# Patient Record
Sex: Female | Born: 1990 | Race: Black or African American | Hispanic: No | Marital: Single | Smoking: Never smoker
Health system: Southern US, Community
[De-identification: ages and names within clinical notes are randomized; demographics above are authoritative.]

## PROBLEM LIST (undated history)

## (undated) DIAGNOSIS — D649 Anemia, unspecified: Secondary | ICD-10-CM

## (undated) HISTORY — PX: NO PAST SURGERIES: SHX2092

---
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
## 2008-12-02 ENCOUNTER — Inpatient Hospital Stay (HOSPITAL_COMMUNITY): Admission: AD | Admit: 2008-12-02 | Discharge: 2008-12-05 | Payer: Self-pay | Admitting: Obstetrics and Gynecology

## 2009-10-23 ENCOUNTER — Emergency Department (HOSPITAL_COMMUNITY): Admission: EM | Admit: 2009-10-23 | Discharge: 2009-10-23 | Payer: Self-pay | Admitting: Emergency Medicine

## 2010-06-15 LAB — URINALYSIS, ROUTINE W REFLEX MICROSCOPIC
Glucose, UA: NEGATIVE mg/dL
Ketones, ur: NEGATIVE mg/dL
pH: 5 (ref 5.0–8.0)

## 2010-06-15 LAB — CBC
MCHC: 34.2 g/dL (ref 30.0–36.0)
RDW: 13 % (ref 11.5–15.5)

## 2010-06-15 LAB — DIFFERENTIAL
Basophils Absolute: 0 10*3/uL (ref 0.0–0.1)
Basophils Relative: 1 % (ref 0–1)
Eosinophils Absolute: 0 10*3/uL (ref 0.0–0.7)
Lymphocytes Relative: 9 % — ABNORMAL LOW (ref 12–46)
Lymphs Abs: 0.7 10*3/uL (ref 0.7–4.0)
Monocytes Absolute: 0.4 10*3/uL (ref 0.1–1.0)
Monocytes Relative: 5 % (ref 3–12)
Neutrophils Relative %: 85 % — ABNORMAL HIGH (ref 43–77)

## 2010-06-15 LAB — WET PREP, GENITAL
Trich, Wet Prep: NONE SEEN
Yeast Wet Prep HPF POC: NONE SEEN

## 2010-06-15 LAB — BASIC METABOLIC PANEL
BUN: 9 mg/dL (ref 6–23)
CO2: 22 mEq/L (ref 19–32)
Calcium: 9.6 mg/dL (ref 8.4–10.5)
Creatinine, Ser: 0.64 mg/dL (ref 0.4–1.2)
GFR calc non Af Amer: >60 mL/min (ref >60)
Glucose, Bld: 87 mg/dL (ref 70–99)

## 2010-06-15 LAB — GC/CHLAMYDIA PROBE AMP, GENITAL: GC Probe Amp, Genital: NEGATIVE

## 2010-07-05 LAB — CBC
HCT: 35.6 % — ABNORMAL LOW (ref 36.0–46.0)
HCT: 37.5 % (ref 36.0–46.0)
Hemoglobin: 12 g/dL (ref 12.0–15.0)
Hemoglobin: 12.6 g/dL (ref 12.0–15.0)
MCHC: 33.6 g/dL (ref 30.0–36.0)
Platelets: 211 10*3/uL (ref 150–400)
Platelets: 212 10*3/uL (ref 150–400)
RBC: 3.89 MIL/uL (ref 3.87–5.11)
WBC: 9.2 10*3/uL (ref 4.0–10.5)

## 2013-02-01 ENCOUNTER — Emergency Department (HOSPITAL_COMMUNITY): Payer: 59

## 2013-02-01 ENCOUNTER — Emergency Department (HOSPITAL_COMMUNITY)
Admission: EM | Admit: 2013-02-01 | Discharge: 2013-02-01 | Disposition: A | Discharge status: Home / Self Care | Payer: 59 | Attending: Emergency Medicine | Admitting: Emergency Medicine

## 2013-02-01 ENCOUNTER — Encounter (HOSPITAL_COMMUNITY): Payer: Self-pay | Admitting: Emergency Medicine

## 2013-02-01 DIAGNOSIS — M542 Cervicalgia: Secondary | ICD-10-CM

## 2013-02-01 DIAGNOSIS — Z862 Personal history of diseases of the blood and blood-forming organs and certain disorders involving the immune mechanism: Secondary | ICD-10-CM | POA: Insufficient documentation

## 2013-02-01 DIAGNOSIS — S0990XA Unspecified injury of head, initial encounter: Secondary | ICD-10-CM | POA: Insufficient documentation

## 2013-02-01 DIAGNOSIS — Y92410 Unspecified street and highway as the place of occurrence of the external cause: Secondary | ICD-10-CM | POA: Insufficient documentation

## 2013-02-01 DIAGNOSIS — S0993XA Unspecified injury of face, initial encounter: Secondary | ICD-10-CM | POA: Insufficient documentation

## 2013-02-01 DIAGNOSIS — Y9389 Activity, other specified: Secondary | ICD-10-CM | POA: Insufficient documentation

## 2013-02-01 HISTORY — DX: Anemia, unspecified: D64.9

## 2013-02-01 MED ORDER — OXYCODONE-ACETAMINOPHEN 5-325 MG PO TABS
2.0000 | ORAL_TABLET | Freq: Once | ORAL | Status: AC
  Administered 2013-02-01: 2 via ORAL
  Filled 2013-02-01: qty 2

## 2013-02-01 MED ORDER — OXYCODONE-ACETAMINOPHEN 5-325 MG PO TABS: 2.0000 | ORAL_TABLET | Freq: Four times a day (QID) | ORAL | Status: DC | PRN

## 2013-02-01 NOTE — ED Notes (Signed)
Dr Otter at bedside  

## 2013-02-01 NOTE — ED Notes (Signed)
C- collar applied by triage nurse.
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
## 2013-06-08 ENCOUNTER — Emergency Department (HOSPITAL_BASED_OUTPATIENT_CLINIC_OR_DEPARTMENT_OTHER)
Admission: EM | Admit: 2013-06-08 | Discharge: 2013-06-09 | Disposition: A | Discharge status: Home / Self Care | Payer: 59 | Attending: Emergency Medicine | Admitting: Emergency Medicine

## 2013-06-08 ENCOUNTER — Encounter (HOSPITAL_BASED_OUTPATIENT_CLINIC_OR_DEPARTMENT_OTHER): Payer: Self-pay | Admitting: Emergency Medicine

## 2013-06-08 DIAGNOSIS — Z862 Personal history of diseases of the blood and blood-forming organs and certain disorders involving the immune mechanism: Secondary | ICD-10-CM | POA: Insufficient documentation

## 2013-06-08 DIAGNOSIS — N949 Unspecified condition associated with female genital organs and menstrual cycle: Secondary | ICD-10-CM | POA: Insufficient documentation

## 2013-06-08 DIAGNOSIS — R102 Pelvic and perineal pain: Secondary | ICD-10-CM

## 2013-06-08 DIAGNOSIS — Z3202 Encounter for pregnancy test, result negative: Secondary | ICD-10-CM | POA: Insufficient documentation

## 2013-06-08 DIAGNOSIS — M545 Low back pain, unspecified: Secondary | ICD-10-CM | POA: Insufficient documentation

## 2013-06-08 LAB — PREGNANCY, URINE: Preg Test, Ur: NEGATIVE

## 2013-06-08 LAB — URINALYSIS, ROUTINE W REFLEX MICROSCOPIC
BILIRUBIN URINE: NEGATIVE
Glucose, UA: NEGATIVE mg/dL
KETONES UR: NEGATIVE mg/dL
Leukocytes, UA: NEGATIVE
Nitrite: NEGATIVE
PROTEIN: NEGATIVE mg/dL
Specific Gravity, Urine: 1.026 (ref 1.005–1.030)
UROBILINOGEN UA: 1 mg/dL (ref 0.0–1.0)
pH: 6 (ref 5.0–8.0)

## 2013-06-08 LAB — URINE MICROSCOPIC-ADD ON

## 2013-06-08 NOTE — ED Notes (Signed)
Periumbilical and suprapubic pain today with low back pain. Denies any urinary sx.

## 2013-06-08 NOTE — ED Provider Notes (Signed)
CSN: [id number]     Arrival date & time 06/08/13  2258 History  This chart was scribed for Hanley SeamenJohn L Ciena Sampley, MD by Dorothey Basemania Sutton, ED Scribe. This patient was seen in room MH06/MH06 and the patient's care was started at 11:12 PM.    Chief Complaint  Patient presents with  . Abdominal Pain   The history is provided by the patient. No language interpreter was used.   HPI Comments: Victoria [name] is a 23 y.o. female who presents to the Emergency Department complaining of a constant, cramping pain to the periumbilical and suprapubic region of the abdomen with associated lower back pain onset earlier today, around 9 hours ago. Patient states that the pain is exacerbated with movement and flexion. She reports some intermittent nausea that she states lasts 30 seconds-one minute. She denies vaginal discharge, dysuria, vomiting, diarrhea. Patient states that she uses Depo Provera for birth control, last injection was about 3 months ago. She reports that her LMP was on 03/11/2013, but that she has had some spotting recently. Patient has an allergy to amoxicillin. Patient has a history of anemia.   Past Medical History  Diagnosis Date  . Anemia    History reviewed. No pertinent past surgical history. No family history on file. History  Substance Use Topics  . Smoking status: Never Smoker   . Smokeless tobacco: Not on file  . Alcohol Use: Yes   OB History   Grav Para Term Preterm Abortions TAB SAB Ect Mult Living                 Review of Systems  A complete 10 system review of systems was obtained and all systems are negative except as noted in the HPI and PMH.    Allergies  Amoxicillin  Home Medications   Current Outpatient Rx  Name  Route  Sig  Dispense  Refill  . BIOTIN 5000 PO   Oral   Take 2 tablets by mouth every morning.         Marland Kitchen. oxyCODONE-acetaminophen (PERCOCET/ROXICET) 5-325 MG per tablet   Oral   Take 2 tablets by mouth every 6 (six) hours as needed for pain.   30  tablet   0    Triage Vitals: BP 142/77  Pulse 110  Temp(Src) 98.6 F (37 C) (Oral)  Resp 16  Ht 5' 4.5" (1.638 m)  Wt 119 lb (53.978 kg)  BMI 20.12 kg/m2  SpO2 99%  LMP 03/11/2013  Physical Exam  Nursing note and vitals reviewed. Constitutional: She is oriented to person, place, and time. She appears well-developed and well-nourished. No distress.  HENT:  Head: Normocephalic and atraumatic.  Eyes: Conjunctivae and EOM are normal. Pupils are equal, round, and reactive to light.  Neck: Normal range of motion. Neck supple.  Cardiovascular: Normal rate, regular rhythm and normal heart sounds.   Pulmonary/Chest: Effort normal and breath sounds normal. No respiratory distress.  Abdominal: Soft. Bowel sounds are normal. She exhibits no distension. There is tenderness.  Tenderness to palpation to the suprapubic region and LLQ.  Genitourinary:  No CVA tenderness.   Musculoskeletal: Normal range of motion.  Neurological: She is alert and oriented to person, place, and time.  Skin: Skin is warm and dry.  Psychiatric: She has a normal mood and affect. Her behavior is normal.  GU: Normal external genitalia; vaginal bleeding; a vaginal discharge; no cervical motion tenderness; mild adnexal tenderness without mass appreciated  ED Course  Procedures (including critical care time)  DIAGNOSTIC STUDIES: Oxygen Saturation is 99% on room air, normal by my interpretation.    COORDINATION OF CARE: 11:16 PM- Will perform a pelvic exam. Will order UA and STD testing. Discussed treatment plan with patient at bedside and patient verbalized agreement.    MDM   Nursing notes and vitals signs, including pulse oximetry, reviewed.  Summary of this visit's results, reviewed by myself:  Labs:  Results for orders placed during the hospital encounter of 06/08/13 (from the past 24 hour(s))  URINALYSIS, ROUTINE W REFLEX MICROSCOPIC     Status: Abnormal   Collection Time    06/08/13 11:05 PM       Result Value Ref Range   Color, Urine YELLOW  YELLOW   APPearance CLEAR  CLEAR   Specific Gravity, Urine 1.026  1.005 - 1.030   pH 6.0  5.0 - 8.0   Glucose, UA NEGATIVE  NEGATIVE mg/dL   Hgb urine dipstick LARGE (*) NEGATIVE   Bilirubin Urine NEGATIVE  NEGATIVE   Ketones, ur NEGATIVE  NEGATIVE mg/dL   Protein, ur NEGATIVE  NEGATIVE mg/dL   Urobilinogen, UA 1.0  0.0 - 1.0 mg/dL   Nitrite NEGATIVE  NEGATIVE   Leukocytes, UA NEGATIVE  NEGATIVE  PREGNANCY, URINE     Status: None   Collection Time    06/08/13 11:05 PM      Result Value Ref Range   Preg Test, Ur NEGATIVE  NEGATIVE  URINE MICROSCOPIC-ADD ON     Status: Abnormal   Collection Time    06/08/13 11:05 PM      Result Value Ref Range   Squamous Epithelial / LPF FEW (*) RARE   WBC, UA 0-2  <3 WBC/hpf   RBC / HPF 3-6  <3 RBC/hpf   Bacteria, UA FEW (*) RARE   Urine-Other MUCOUS PRESENT     12:08 AM Examination not consistent with PID. Suspect patient's symptoms are related to withdrawal from her Depo-Provera. She has an OB/GYN with whom she can followup. We will advise NSAIDs for cramping in the meantime.  I personally performed the services described in this documentation, which was scribed in my presence.  The recorded information has been reviewed and is accurate.    Hanley Seamen, MD 06/09/13 [phone]

## 2013-06-09 LAB — HIV ANTIBODY (ROUTINE TESTING W REFLEX): HIV: NONREACTIVE

## 2013-06-09 MED ORDER — NAPROXEN SODIUM 275 MG PO TABS: ORAL_TABLET | ORAL | Status: DC

## 2013-06-09 MED ORDER — NAPROXEN 250 MG PO TABS
500.0000 mg | ORAL_TABLET | Freq: Two times a day (BID) | ORAL | Status: DC
  Administered 2013-06-09: 500 mg via ORAL
  Filled 2013-06-09: qty 2

## 2013-06-09 NOTE — Discharge Instructions (Signed)
Pelvic Pain, Female °Female pelvic pain can be caused by many different things and start from a variety of places. Pelvic pain refers to pain that is located in the lower half of the abdomen and between your hips. The pain may occur over a short period of time (acute) or may be reoccurring (chronic). The cause of pelvic pain may be related to disorders affecting the female reproductive organs (gynecologic), but it may also be related to the bladder, kidney stones, an intestinal complication, or muscle or skeletal problems. Getting help right away for pelvic pain is important, especially if there has been severe, sharp, or a sudden onset of unusual pain. It is also important to get help right away because some types of pelvic pain can be life threatening.  °CAUSES  °Below are only some of the causes of pelvic pain. The causes of pelvic pain can be in one of several categories.  °· Gynecologic. °· Pelvic inflammatory disease. °· Sexually transmitted infection. °· Ovarian cyst or a twisted ovarian ligament (ovarian torsion). °· Uterine lining that grows outside the uterus (endometriosis). °· Fibroids, cysts, or tumors. °· Ovulation. °· Pregnancy. °· Pregnancy that occurs outside the uterus (ectopic pregnancy). °· Miscarriage. °· Labor. °· Abruption of the placenta or ruptured uterus. °· Infection. °· Uterine infection (endometritis). °· Bladder infection. °· Diverticulitis. °· Miscarriage related to a uterine infection (septic abortion). °· Bladder. °· Inflammation of the bladder (cystitis). °· Kidney stone(s). °· Gastrointenstinal. °· Constipation. °· Diverticulitis. °· Neurologic. °· Trauma. °· Feeling pelvic pain because of mental or emotional causes (psychosomatic). °· Cancers of the bowel or pelvis. °EVALUATION  °Your caregiver will want to take a careful history of your concerns. This includes recent changes in your health, a careful gynecologic history of your periods (menses), and a sexual history. Obtaining  your family history and medical history is also important. Your caregiver may suggest a pelvic exam. A pelvic exam will help identify the location and severity of the pain. It also helps in the evaluation of which organ system may be involved. In order to identify the cause of the pelvic pain and be properly treated, your caregiver may order tests. These tests may include:  °· A pregnancy test. °· Pelvic ultrasonography. °· An X-ray exam of the abdomen. °· A urinalysis or evaluation of vaginal discharge. °· Blood tests. °HOME CARE INSTRUCTIONS  °· Only take over-the-counter or prescription medicines for pain, discomfort, or fever as directed by your caregiver.   °· Rest as directed by your caregiver.   °· Eat a balanced diet.   °· Drink enough fluids to make your urine clear or pale yellow, or as directed.   °· Avoid sexual intercourse if it causes pain.   °· Apply warm or cold compresses to the lower abdomen depending on which one helps the pain.   °· Avoid stressful situations.   °· Keep a journal of your pelvic pain. Write down when it started, where the pain is located, and if there are things that seem to be associated with the pain, such as food or your menstrual cycle. °· Follow up with your caregiver as directed.   °SEEK MEDICAL CARE IF: °· Your medicine does not help your pain. °· You have abnormal vaginal discharge. °SEEK IMMEDIATE MEDICAL CARE IF:  °· You have heavy bleeding from the vagina.   °· Your pelvic pain increases.   °· You feel lightheaded or faint.   °· You have chills.   °· You have pain with urination or blood in your urine.   °· You have uncontrolled   diarrhea or vomiting.   °· You have a fever or persistent symptoms for more than 3 days. °· You have a fever and your symptoms suddenly get worse.   °· You are being physically or sexually abused.   °MAKE SURE YOU: °· Understand these instructions. °· Will watch your condition. °· Will get help if you are not doing well or get worse. °Document  Released: 02/12/2004 Document Revised: 09/16/2011 Document Reviewed: 07/07/2011 °[employer]® Patient Information ©2014 [employer]. ° °

## 2013-06-10 LAB — GC/CHLAMYDIA PROBE AMP
CT PROBE, AMP APTIMA: NEGATIVE
GC PROBE AMP APTIMA: NEGATIVE

## 2014-03-02 LAB — OB RESULTS CONSOLE GC/CHLAMYDIA
Chlamydia: NEGATIVE
Gonorrhea: NEGATIVE

## 2014-03-16 LAB — OB RESULTS CONSOLE ABO/RH: RH Type: POSITIVE

## 2014-03-16 LAB — OB RESULTS CONSOLE HIV ANTIBODY (ROUTINE TESTING): HIV: NONREACTIVE

## 2014-03-16 LAB — OB RESULTS CONSOLE ANTIBODY SCREEN: Antibody Screen: NEGATIVE

## 2014-03-16 LAB — OB RESULTS CONSOLE RPR: RPR: NONREACTIVE

## 2014-03-16 LAB — OB RESULTS CONSOLE HEPATITIS B SURFACE ANTIGEN: HEP B S AG: NEGATIVE

## 2014-03-16 LAB — OB RESULTS CONSOLE RUBELLA ANTIBODY, IGM: Rubella: NON-IMMUNE/NOT IMMUNE

## 2014-03-31 NOTE — L&D Delivery Note (Signed)
Final Labor Progress Note At 1100 pt reports an increased in rectal pressure.  FHR remained reassuring with occasional variable decelerations.  Vaginal Delivery Note GBS was negtive.  Cervical dilation was complete at  1115.  NICHD Category 2.    Pushing with guidance began at  1117.   After 8 minutes of pushing the head, shoulders and the body of a viable female infant "Kemet" delivered spontaneously with maternal effort in the LOA position at 1125.   With vigorous tone and spontaneous cry, the infant was placed on moms abd.  After the umbilical cord was clamped it was cut by the FOB, then cord blood was obtained for evaluation.  Spontaneous delivery of a intact placenta with a 3 vessel cord via Shultz at  1131.   Episiotomy: None   The vulva, perineum, vaginal vault, rectum and cervix were inspected no repairs needed.  Postpartum pitocin as ordered.  Fundus firm, lochia minimum, bleeding under control. EBL 100, Pt hemodynamically stable.   Sponge, laps and needle count correct and verified with the primary care nurse.  Attending MD available at all times.    Routine postpartum orders   Mother desires depo for contraception   Infant to have out patient circumcision   Placenta to pathology: NO     Cord Gases sent to lab: NO Cord blood sent to lab: YES   APGARS:  9 at 1 minute and 9 at 5 minutes Weight:. pending     Both mom and baby were left in stable condition, baby skin to skin.      Nashla Althoff, CNM, MSN 10/02/2014. 11:50 AM

## 2014-09-07 LAB — OB RESULTS CONSOLE GBS: GBS: NEGATIVE

## 2014-09-30 ENCOUNTER — Inpatient Hospital Stay (HOSPITAL_COMMUNITY)
Admission: AD | Admit: 2014-09-30 | Discharge: 2014-09-30 | Disposition: A | Discharge status: Home / Self Care | Payer: Managed Care, Other (non HMO) | Source: Ambulatory Visit | Attending: Obstetrics and Gynecology | Admitting: Obstetrics and Gynecology

## 2014-09-30 ENCOUNTER — Inpatient Hospital Stay (HOSPITAL_COMMUNITY): Payer: Managed Care, Other (non HMO)

## 2014-09-30 ENCOUNTER — Encounter (HOSPITAL_COMMUNITY): Payer: Self-pay | Admitting: *Deleted

## 2014-09-30 DIAGNOSIS — O9989 Other specified diseases and conditions complicating pregnancy, childbirth and the puerperium: Secondary | ICD-10-CM

## 2014-09-30 DIAGNOSIS — Z3A39 39 weeks gestation of pregnancy: Secondary | ICD-10-CM | POA: Diagnosis not present

## 2014-09-30 DIAGNOSIS — Z2839 Other underimmunization status: Secondary | ICD-10-CM

## 2014-09-30 DIAGNOSIS — IMO0002 Reserved for concepts with insufficient information to code with codable children: Secondary | ICD-10-CM | POA: Diagnosis not present

## 2014-09-30 DIAGNOSIS — O09899 Supervision of other high risk pregnancies, unspecified trimester: Secondary | ICD-10-CM

## 2014-09-30 DIAGNOSIS — Z283 Underimmunization status: Secondary | ICD-10-CM

## 2014-09-30 DIAGNOSIS — Z889 Allergy status to unspecified drugs, medicaments and biological substances status: Secondary | ICD-10-CM

## 2014-09-30 NOTE — MAU Provider Note (Signed)
  History   24 yo [id number] 39 4/7 weeks presented unannounced with UCs q 5 min since 3:40p. Denies leaking or bleeding, reports +FM.  Cervix was 2 cm, 50%, -2 at office visit 6/30.  Patient Active Problem List   Diagnosis Date Noted  . 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 Drug allergy--amoxicillin 09/30/2014  . Rubella non-immune status, antepartum 09/30/2014    Chief Complaint  Patient presents with  . Labor Eval   HPI:  As above  OB History    Gravida Para Term Preterm AB TAB SAB Ectopic Multiple Living   3 1 1  1 1    1       Past Medical History  Diagnosis Date  . Anemia     Past Surgical History  Procedure Laterality Date  . No past surgeries      No family history on file.  History  Substance Use Topics  . Smoking status: Never Smoker   . Smokeless tobacco: Not on file  . Alcohol Use: No    Allergies:  Allergies  Allergen Reactions  . Amoxicillin Other (See Comments)    Reaction:  Unknown     Prescriptions prior to admission  Medication Sig Dispense Refill Last Dose  . Prenatal Vit-Fe Fumarate-FA (PRENATAL MULTIVITAMIN) TABS tablet Take 1 tablet by mouth daily.   09/29/2014 at Unknown time  . ranitidine (ZANTAC) 150 MG tablet Take 150 mg by mouth 2 (two) times daily as needed for heartburn.    09/29/2014 at Unknown time  . naproxen sodium (ANAPROX DS) 275 MG tablet Take 1 tablet twice daily as needed for pelvic cramping. (Patient not taking: Reported on 09/30/2014) 20 tablet 0   . oxyCODONE-acetaminophen (PERCOCET/ROXICET) 5-325 MG per tablet Take 2 tablets by mouth every 6 (six) hours as needed for pain. (Patient not taking: Reported on 09/30/2014) 30 tablet 0     ROS;  Contractions, +FM Physical Exam   Blood pressure 136/75, pulse 88, temperature 98.5 F (36.9 C), resp. rate 18, last menstrual period 12/27/2013.    Physical Exam  In NAD Chest clear Heart RRR without murmur Abd gravid, NT Pelvic--posterior, 2 cm,50%, vtx, -2 Ext WNL  FHR  Reassuring initially, but non-reactive.  Now Category 1. UCs q 6 min, mild.  ED Course  Assessment: IUP at 39 4/7 weeks Early vs prodromal labor GBS negative Planning waterbirth--needs consents signed. Rubella non-immune  Plan: Ambulate x 1 hour, then re-evaluate.   Nigel BridgemanLATHAM, VICKI CNM, MSN 09/30/2014 6:30 PM   Addendum 2100:  S: Patient reports contractions are not as close together as upon arrival  O: SVE: 2/Thick/-3/Firm/Posterior  A: IUP at 39.4wks Cat I FT Contractions  P: Discussed SVE EFM Cat I, but NST not reactive Turn to left side and give H2O If not reactive after 30 minutes, send for BPP  Addendum 2220:  S: Nurse call reports BPP completed O: BPP 8/8 A: IUP at 39.4wks Antenatal Testing P: Okay to discharge to home  Marlene BastMLY, Lema Heinkel LYNN MSN, CNM 09/30/2014 10:42 PM

## 2014-09-30 NOTE — Progress Notes (Signed)
Emly called to MAU.Observe pt for 20 minutes. If FHT's reactive, Discahrge home. If not, BPP.

## 2014-09-30 NOTE — Discharge Instructions (Signed)
Third Trimester of Pregnancy The third trimester is from week 29 through week 42, months 7 through 9. The third trimester is a time when the fetus is growing rapidly. At the end of the ninth month, the fetus is about 20 inches in length and weighs 6-10 pounds.  BODY CHANGES Your body goes through many changes during pregnancy. The changes vary from woman to woman.   Your weight will continue to increase. You can expect to gain 25-35 pounds (11-16 kg) by the end of the pregnancy.  You may begin to get stretch marks on your hips, abdomen, and breasts.  You may urinate more often because the fetus is moving lower into your pelvis and pressing on your bladder.  You may develop or continue to have heartburn as a result of your pregnancy.  You may develop constipation because certain hormones are causing the muscles that push waste through your intestines to slow down.  You may develop hemorrhoids or swollen, bulging veins (varicose veins).  You may have pelvic pain because of the weight gain and pregnancy hormones relaxing your joints between the bones in your pelvis. Backaches may result from overexertion of the muscles supporting your posture.  You may have changes in your hair. These can include thickening of your hair, rapid growth, and changes in texture. Some women also have hair loss during or after pregnancy, or hair that feels dry or thin. Your hair will most likely return to normal after your baby is born.  Your breasts will continue to grow and be tender. A yellow discharge may leak from your breasts called colostrum.  Your belly button may stick out.  You may feel short of breath because of your expanding uterus.  You may notice the fetus "dropping," or moving lower in your abdomen.  You may have a bloody mucus discharge. This usually occurs a few days to a week before labor begins.  Your cervix becomes thin and soft (effaced) near your due date. WHAT TO EXPECT AT YOUR PRENATAL  EXAMS  You will have prenatal exams every 2 weeks until week 36. Then, you will have weekly prenatal exams. During a routine prenatal visit:  You will be weighed to make sure you and the fetus are growing normally.  Your blood pressure is taken.  Your abdomen will be measured to track your baby's growth.  The fetal heartbeat will be listened to.  Any test results from the previous visit will be discussed.  You may have a cervical check near your due date to see if you have effaced. At around 36 weeks, your caregiver will check your cervix. At the same time, your caregiver will also perform a test on the secretions of the vaginal tissue. This test is to determine if a type of bacteria, Group B streptococcus, is present. Your caregiver will explain this further. Your caregiver may ask you:  What your birth plan is.  How you are feeling.  If you are feeling the baby move.  If you have had any abnormal symptoms, such as leaking fluid, bleeding, severe headaches, or abdominal cramping.  If you have any questions. Other tests or screenings that may be performed during your third trimester include:  Blood tests that check for low iron levels (anemia).  Fetal testing to check the health, activity level, and growth of the fetus. Testing is done if you have certain medical conditions or if there are problems during the pregnancy. FALSE LABOR You may feel small, irregular contractions that   eventually go away. These are called Braxton Hicks contractions, or false labor. Contractions may last for hours, days, or even weeks before true labor sets in. If contractions come at regular intervals, intensify, or become painful, it is best to be seen by your caregiver.  SIGNS OF LABOR   Menstrual-like cramps.  Contractions that are 5 minutes apart or less.  Contractions that start on the top of the uterus and spread down to the lower abdomen and back.  A sense of increased pelvic pressure or back  pain.  A watery or bloody mucus discharge that comes from the vagina. If you have any of these signs before the 37th week of pregnancy, call your caregiver right away. You need to go to the hospital to get checked immediately. HOME CARE INSTRUCTIONS   Avoid all smoking, herbs, alcohol, and unprescribed drugs. These chemicals affect the formation and growth of the baby.  Follow your caregiver's instructions regarding medicine use. There are medicines that are either safe or unsafe to take during pregnancy.  Exercise only as directed by your caregiver. Experiencing uterine cramps is a good sign to stop exercising.  Continue to eat regular, healthy meals.  Wear a good support bra for breast tenderness.  Do not use hot tubs, steam rooms, or saunas.  Wear your seat belt at all times when driving.  Avoid raw meat, uncooked cheese, cat litter boxes, and soil used by cats. These carry germs that can cause birth defects in the baby.  Take your prenatal vitamins.  Try taking a stool softener (if your caregiver approves) if you develop constipation. Eat more high-fiber foods, such as fresh vegetables or fruit and whole grains. Drink plenty of fluids to keep your urine clear or pale yellow.  Take warm sitz baths to soothe any pain or discomfort caused by hemorrhoids. Use hemorrhoid cream if your caregiver approves.  If you develop varicose veins, wear support hose. Elevate your feet for 15 minutes, 3-4 times a day. Limit salt in your diet.  Avoid heavy lifting, wear low heal shoes, and practice good posture.  Rest a lot with your legs elevated if you have leg cramps or low back pain.  Visit your dentist if you have not gone during your pregnancy. Use a soft toothbrush to brush your teeth and be gentle when you floss.  A sexual relationship may be continued unless your caregiver directs you otherwise.  Do not travel far distances unless it is absolutely necessary and only with the approval  of your caregiver.  Take prenatal classes to understand, practice, and ask questions about the labor and delivery.  Make a trial run to the hospital.  Pack your hospital bag.  Prepare the baby's nursery.  Continue to go to all your prenatal visits as directed by your caregiver. SEEK MEDICAL CARE IF:  You are unsure if you are in labor or if your water has broken.  You have dizziness.  You have mild pelvic cramps, pelvic pressure, or nagging pain in your abdominal area.  You have persistent nausea, vomiting, or diarrhea.  You have a bad smelling vaginal discharge.  You have pain with urination. SEEK IMMEDIATE MEDICAL CARE IF:   You have a fever.  You are leaking fluid from your vagina.  You have spotting or bleeding from your vagina.  You have severe abdominal cramping or pain.  You have rapid weight loss or gain.  You have shortness of breath with chest pain.  You notice sudden or extreme swelling   of your face, hands, ankles, feet, or legs.  You have not felt your baby move in over an hour.  You have severe headaches that do not go away with medicine.  You have vision changes. Document Released: 03/11/2001 Document Revised: 03/22/2013 Document Reviewed: 05/18/2012 Exit[employer] Patient Information 2015 [employer]. This information is not intended to replace advice given to you by your health care provider. Make sure you discuss any questions you have with your health care provider.  

## 2014-10-02 ENCOUNTER — Encounter (HOSPITAL_COMMUNITY): Payer: Self-pay

## 2014-10-02 ENCOUNTER — Inpatient Hospital Stay (HOSPITAL_COMMUNITY)
Admission: AD | Admit: 2014-10-02 | Discharge: 2014-10-03 | DRG: 775 | Disposition: A | Discharge status: Home / Self Care | Payer: Managed Care, Other (non HMO) | Source: Ambulatory Visit | Attending: Obstetrics and Gynecology | Admitting: Obstetrics and Gynecology

## 2014-10-02 DIAGNOSIS — Z3A39 39 weeks gestation of pregnancy: Secondary | ICD-10-CM | POA: Diagnosis present

## 2014-10-02 LAB — CBC
HCT: 38.5 % (ref 36.0–46.0)
HEMOGLOBIN: 12.7 g/dL (ref 12.0–15.0)
MCH: 27.4 pg (ref 26.0–34.0)
MCHC: 33 g/dL (ref 30.0–36.0)
MCV: 83 fL (ref 78.0–100.0)
PLATELETS: 226 10*3/uL (ref 150–400)
RBC: 4.64 MIL/uL (ref 3.87–5.11)
RDW: 14.6 % (ref 11.5–15.5)
WBC: 8.7 10*3/uL (ref 4.0–10.5)

## 2014-10-02 LAB — TYPE AND SCREEN
ABO/RH(D): AB POS
ANTIBODY SCREEN: NEGATIVE

## 2014-10-02 LAB — ABO/RH: ABO/RH(D): AB POS

## 2014-10-02 MED ORDER — DIPHENHYDRAMINE HCL 50 MG/ML IJ SOLN: 12.5000 mg | INTRAMUSCULAR | Status: DC | PRN

## 2014-10-02 MED ORDER — ONDANSETRON HCL 4 MG PO TABS: 4.0000 mg | ORAL_TABLET | ORAL | Status: DC | PRN

## 2014-10-02 MED ORDER — OXYTOCIN 40 UNITS IN LACTATED RINGERS INFUSION - SIMPLE MED
62.5000 mL/h | INTRAVENOUS | Status: DC
  Administered 2014-10-02: 62.5 mL/h via INTRAVENOUS
  Filled 2014-10-02: qty 1000

## 2014-10-02 MED ORDER — SENNOSIDES-DOCUSATE SODIUM 8.6-50 MG PO TABS
2.0000 | ORAL_TABLET | ORAL | Status: DC
  Administered 2014-10-02: 2 via ORAL
  Filled 2014-10-02: qty 2

## 2014-10-02 MED ORDER — LACTATED RINGERS IV SOLN: 500.0000 mL | INTRAVENOUS | Status: DC | PRN

## 2014-10-02 MED ORDER — ACETAMINOPHEN 325 MG PO TABS: 650.0000 mg | ORAL_TABLET | ORAL | Status: DC | PRN

## 2014-10-02 MED ORDER — LANOLIN HYDROUS EX OINT: TOPICAL_OINTMENT | CUTANEOUS | Status: DC | PRN

## 2014-10-02 MED ORDER — IBUPROFEN 600 MG PO TABS
600.0000 mg | ORAL_TABLET | Freq: Four times a day (QID) | ORAL | Status: DC
  Administered 2014-10-02 – 2014-10-03 (×6): 600 mg via ORAL
  Filled 2014-10-02 (×6): qty 1

## 2014-10-02 MED ORDER — LIDOCAINE HCL (PF) 1 % IJ SOLN
30.0000 mL | INTRAMUSCULAR | Status: DC | PRN
  Filled 2014-10-02: qty 30

## 2014-10-02 MED ORDER — MEDROXYPROGESTERONE ACETATE 150 MG/ML IM SUSP: 150.0000 mg | INTRAMUSCULAR | Status: DC | PRN

## 2014-10-02 MED ORDER — FENTANYL 2.5 MCG/ML BUPIVACAINE 1/10 % EPIDURAL INFUSION (WH - ANES): 14.0000 mL/h | INTRAMUSCULAR | Status: DC | PRN

## 2014-10-02 MED ORDER — SIMETHICONE 80 MG PO CHEW: 80.0000 mg | CHEWABLE_TABLET | ORAL | Status: DC | PRN

## 2014-10-02 MED ORDER — OXYCODONE-ACETAMINOPHEN 5-325 MG PO TABS: 1.0000 | ORAL_TABLET | ORAL | Status: DC | PRN

## 2014-10-02 MED ORDER — CITRIC ACID-SODIUM CITRATE 334-500 MG/5ML PO SOLN: 30.0000 mL | ORAL | Status: DC | PRN

## 2014-10-02 MED ORDER — TETANUS-DIPHTH-ACELL PERTUSSIS 5-2.5-18.5 LF-MCG/0.5 IM SUSP: 0.5000 mL | Freq: Once | INTRAMUSCULAR | Status: DC

## 2014-10-02 MED ORDER — EPHEDRINE 5 MG/ML INJ
10.0000 mg | INTRAVENOUS | Status: DC | PRN
  Filled 2014-10-02: qty 2

## 2014-10-02 MED ORDER — DIBUCAINE 1 % RE OINT
1.0000 "application " | TOPICAL_OINTMENT | RECTAL | Status: DC | PRN
  Administered 2014-10-02: 1 via RECTAL
  Filled 2014-10-02: qty 28

## 2014-10-02 MED ORDER — OXYCODONE-ACETAMINOPHEN 5-325 MG PO TABS: 2.0000 | ORAL_TABLET | ORAL | Status: DC | PRN

## 2014-10-02 MED ORDER — BENZOCAINE-MENTHOL 20-0.5 % EX AERO
1.0000 "application " | INHALATION_SPRAY | CUTANEOUS | Status: DC | PRN
  Administered 2014-10-02: 1 via TOPICAL
  Filled 2014-10-02: qty 56

## 2014-10-02 MED ORDER — LACTATED RINGERS IV SOLN
INTRAVENOUS | Status: DC
  Administered 2014-10-02: 09:00:00 via INTRAVENOUS

## 2014-10-02 MED ORDER — MEASLES, MUMPS & RUBELLA VAC ~~LOC~~ INJ
0.5000 mL | INJECTION | Freq: Once | SUBCUTANEOUS | Status: AC
  Administered 2014-10-03: 0.5 mL via SUBCUTANEOUS
  Filled 2014-10-02 (×2): qty 0.5

## 2014-10-02 MED ORDER — OXYTOCIN BOLUS FROM INFUSION
500.0000 mL | INTRAVENOUS | Status: DC
  Administered 2014-10-02: 500 mL via INTRAVENOUS

## 2014-10-02 MED ORDER — ONDANSETRON HCL 4 MG/2ML IJ SOLN: 4.0000 mg | INTRAMUSCULAR | Status: DC | PRN

## 2014-10-02 MED ORDER — WITCH HAZEL-GLYCERIN EX PADS
1.0000 "application " | MEDICATED_PAD | CUTANEOUS | Status: DC | PRN
  Administered 2014-10-02: 1 via TOPICAL

## 2014-10-02 MED ORDER — ZOLPIDEM TARTRATE 5 MG PO TABS: 5.0000 mg | ORAL_TABLET | Freq: Every evening | ORAL | Status: DC | PRN

## 2014-10-02 MED ORDER — PHENYLEPHRINE 40 MCG/ML (10ML) SYRINGE FOR IV PUSH (FOR BLOOD PRESSURE SUPPORT)
80.0000 ug | PREFILLED_SYRINGE | INTRAVENOUS | Status: DC | PRN
  Filled 2014-10-02: qty 2

## 2014-10-02 MED ORDER — ONDANSETRON HCL 4 MG/2ML IJ SOLN: 4.0000 mg | Freq: Four times a day (QID) | INTRAMUSCULAR | Status: DC | PRN

## 2014-10-02 MED ORDER — PRENATAL MULTIVITAMIN CH
1.0000 | ORAL_TABLET | Freq: Every day | ORAL | Status: DC
  Administered 2014-10-03: 1 via ORAL
  Filled 2014-10-02: qty 1

## 2014-10-02 MED ORDER — DIPHENHYDRAMINE HCL 25 MG PO CAPS: 25.0000 mg | ORAL_CAPSULE | Freq: Four times a day (QID) | ORAL | Status: DC | PRN

## 2014-10-02 NOTE — Lactation Note (Signed)
This note was copied from the chart of Victoria [name]. Lactation Consultation Note  Patient Name: Victoria [name]: 10/02/2014 Reason for consult: Initial assessment Baby 9 hours old. Mom states that she nurse first child 1 week, and baby was difficult to latch. Mom reports that she really wants to be successful with BF this baby. Mom return-demonstrated hand expression with no colostrum present. Assisted mom to latch baby in football position to left breast. Baby latched deeply, suckling rhythmically, with a few swallows noted. Mom states that this position is comfortable for her. Baby nurse for 10 minutes, then mom re-latched baby herself and FOB flanged baby's lower lip. Enc parents to nurse with cues. Baby has not had a void or stool yet. Discussed with parents that this is not unusual, and everyone will be looking for baby to void and stool. [facility], aware of OP/BFSG, community resources, and [facility] after [facility]. Enc parents to call for assistance with latching as needed.   Maternal Data Has patient been taught Hand Expression?: Yes Does the patient have breastfeeding experience prior to this delivery?: Yes  Feeding Feeding Type: Breast Fed Length of feed:  (LC assessed first 10 minutes of BF.)  LATCH Score/Interventions Latch: Grasps breast easily, tongue down, lips flanged, rhythmical sucking. Intervention(s): Adjust position;Assist with latch;Breast compression  Audible Swallowing: A few with stimulation  Type of Nipple: Everted at rest and after stimulation  Comfort (Breast/Nipple): Soft / non-tender     Hold (Positioning): Assistance needed to correctly position infant at breast and maintain latch. Intervention(s): Breastfeeding basics reviewed;Support Pillows;Position options;Skin to skin  LATCH Score: 8  Lactation Tools Discussed/Used     Consult Status Consult Status: Follow-up [name]: 10/03/14 Follow-up type:  In-patient    Geralynn OchsWILLIARD, Jacee Enerson 10/02/2014, 9:03 PM

## 2014-10-02 NOTE — H&P (Signed)
Victoria CarwinMercia Beverley FiedlerJanet [name] is a 24 y.o. female, G3 P1 at 39.6 weeks presented to MAU earlier today at 2cm, progressed to 4cm at 0630.  At 0900 the MAU nurse reports she was 5cm.  Pt admitted to L&D for expectant management.  Pt desires a waterbirth  Patient Active Problem List   Diagnosis Date Noted  . Active labor at term 10/02/2014  . 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 Drug allergy--amoxicillin 09/30/2014  . Rubella non-immune status, antepartum 09/30/2014    Pregnancy Course: Patient entered care at 9.2 weeks.   EDC of 10/03/14 was established by LMP.      US evaluations:   20.0 weeks - Anatomy:  EFW 11oz - 30.1%, vertex, anterior placenta, cervix closed       Significant prenatal events:   none   Last evaluation:   09/28/14 in the office 2/50/-1  Reason for admission:  labor  Pt States:   Contractions Frequency: 6         Contraction severity: strong         Fetal activity: +FM  OB History    Gravida Para Term Preterm AB TAB SAB Ectopic Multiple Living   3 1 1  1 1    1      Past Medical History  Diagnosis Date  . Anemia    Past Surgical History  Procedure Laterality Date  . No past surgeries     Family History: family history is not on file. Social History:  reports that she has never smoked. She does not have any smokeless tobacco history on file. She reports that she does not drink alcohol or use illicit drugs.   Prenatal Transfer Tool  Maternal Diabetes: No Genetic Screening: Declined Maternal Ultrasounds/Referrals: Declined Fetal Ultrasounds or other Referrals:  None Maternal Substance Abuse:  No Significant Maternal Medications:  None Significant Maternal Lab Results: Rubella Non immune   ROS:  See HPI above, all other systems are negative  Allergies  Allergen Reactions  . Amoxicillin Other (See Comments)    Reaction:  Unknown     VE at 1000 5-6/90/-1.  Blood pressure 129/66, pulse 79, temperature 97.9 F (36.6 C), temperature source Oral, resp.  rate 18, height 5\' 5"  (1.651 m), weight 155 lb (70.308 kg), last menstrual period 12/27/2013, SpO2 100 %.  Maternal Exam:  Uterine Assessment: Contraction frequency is rare.  Abdomen: Gravid, non tender. Fundal height is aga.  Normal external genitalia, vulva, cervix, uterus and adnexa.  No lesions noted on exam.  Pelvis adequate for delivery.  Fetal presentation: Vertex by VE  Fetal Exam:  Monitor Surveillance : Continuous Monitoring  Until catagory 1  Mode: Ultrasound.  NICHD: Category 2 CTXs: Q 6minutes EFW   7 lbs  Physical Exam: Nursing note and vitals reviewed General: alert and cooperative She appears well nourished Psychiatric: Normal mood and affect. Her behavior is normal Head: Normocephalic Eyes: Pupils are equal, round, and reactive to light Neck: Normal range of motion Cardiovascular: RRR without murmur  Respiratory: CTAB. Effort normal  Abd: soft, non-tender, +BS, no rebound, no guarding  Genitourinary: Vagina normal  Neurological: A&Ox3 Skin: Warm and dry  Musculoskeletal: Normal range of motion  Homan's sign negative bilaterally No evidence of DVTs.  Edema: no edema DTR: 2+ Clonus: None   Prenatal labs: ABO, Rh: --/--/AB POS, AB POS (07/04 0925) Antibody: NEG (07/04 0925) Rubella:   non immune RPR: Nonreactive (12/17 0000)  HBsAg: Negative (12/17 0000)  HIV: Non-reactive (12/17 0000)  GBS: Negative (  06/09 0000) Sickle cell/Hgb electrophoresis:  WNL Pap:  wnl 03/03/15 GC:  negative  Chlamydia: negative Genetic screenings:   Glucola:  wnl  Assessment:  IUP at 39.6 weeks NICHD: Category 2, occasional variable but over all reassuring Membranes: intact Bishop Score: 7 GBS negative  Plan:  Admit to L&D for expectant management of labor. IV pain medication per orders PRN Epidural per patient request Foley cath after patient is comfortable with epidural Anticipate SVD  Labor mgmt as ordered Desires water birth   Okay to ambulate around  unit with wireless monitors  Okay to get up and shower without monitoring   May auscultate FHR intermittently,  if expectant management     q 30 min in active labor - x 5 minutes     q 15 min in transition - before during and after a ctx     q 5 min with pushing - before during and after a ctx.     May ambulate without monitoring.     If no active labor, may do NST q 2 hours.   Attending MD available at all times.  Fujie Dickison, CNM, MSN 10/02/2014, 12:58 PM

## 2014-10-02 NOTE — MAU Note (Signed)
Pt presents with worsening contractions, denies bleeding or ROM.

## 2014-10-02 NOTE — MAU Provider Note (Signed)
History    Victoria CarwinMercia Beverley FiedlerJanet [name] is a 24 y.o. G3P1 at 39.6wks who presents for contractions.  Patient states contractions have increased in intensity and frequency since phone conversation with provider earlier in the day. Patient reports good fetal movement and denies VB.  Patient states she has been having LOF since arrival.    Patient Active Problem List   Diagnosis Date Noted  . 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 Drug allergy--amoxicillin 09/30/2014  . Rubella non-immune status, antepartum 09/30/2014    No chief complaint on file.  HPI  OB History    Gravida Para Term Preterm AB TAB SAB Ectopic Multiple Living   3 1 1  1 1    1       Past Medical History  Diagnosis Date  . Anemia     Past Surgical History  Procedure Laterality Date  . No past surgeries      History reviewed. No pertinent family history.  History  Substance Use Topics  . Smoking status: Never Smoker   . Smokeless tobacco: Not on file  . Alcohol Use: No    Allergies:  Allergies  Allergen Reactions  . Amoxicillin Other (See Comments)    Reaction:  Unknown     Prescriptions prior to admission  Medication Sig Dispense Refill Last Dose  . Prenatal Vit-Fe Fumarate-FA (PRENATAL MULTIVITAMIN) TABS tablet Take 1 tablet by mouth daily.   Past Week at Unknown time  . ranitidine (ZANTAC) 150 MG tablet Take 150 mg by mouth 2 (two) times daily as needed for heartburn.    Past Week at Unknown time    ROS  See HPI Above Physical Exam   Blood pressure 114/68, pulse 70, temperature 98.1 F (36.7 C), temperature source Oral, resp. rate 19, height 5\' 5"  (1.651 m), weight 70.308 kg (155 lb), last menstrual period 12/27/2013, SpO2 100 %.  No results found for this or any previous visit (from the past 24 hour(s)).  Physical Exam SVE: 4/70/-2  FHR: bpm, Mod Var, -Decels, +Accels UC: Q625min, palpates moderate ED Course  Assessment: IUP at 39.6wks Cat I FT Early Labor  Plan: -Await reactive  NST -Ambulate in halls -Will give report to V. Standard, CNM  Follow Up (Time) - -   Victoria [name] CNM, MSN 10/02/2014 6:29 AM

## 2014-10-03 LAB — CBC
HEMATOCRIT: 33.3 % — AB (ref 36.0–46.0)
Hemoglobin: 11 g/dL — ABNORMAL LOW (ref 12.0–15.0)
MCH: 27.4 pg (ref 26.0–34.0)
MCHC: 33 g/dL (ref 30.0–36.0)
MCV: 82.8 fL (ref 78.0–100.0)
PLATELETS: 202 10*3/uL (ref 150–400)
RBC: 4.02 MIL/uL (ref 3.87–5.11)
RDW: 14.6 % (ref 11.5–15.5)
WBC: 11.3 10*3/uL — ABNORMAL HIGH (ref 4.0–10.5)

## 2014-10-03 LAB — RPR: RPR Ser Ql: NONREACTIVE

## 2014-10-03 MED ORDER — IBUPROFEN 600 MG PO TABS: 600.0000 mg | ORAL_TABLET | Freq: Four times a day (QID) | ORAL | Status: AC | PRN

## 2014-10-03 NOTE — Lactation Note (Signed)
This note was copied from the chart of Victoria Mal AmabileMercia Kreft. Lactation Consultation Note Mom having an early d/c home. Baby BF great. Mom holding baby STS in football position stimulating baby to wake and BF. Baby latched well to everted nipples. Discussed positioning, let down, breast massage and engorgement. Encouraged to cont. To document I&O. Reminded of resources and support group. Patient Name: Victoria Mal AmabileMercia Nedd ZOXWR'UToday's Date: 10/03/2014 Reason for consult: Follow-up assessment   Maternal Data    Feeding Feeding Type: Breast Fed Length of feed: 15 min  LATCH Score/Interventions Latch: Grasps breast easily, tongue down, lips flanged, rhythmical sucking.  Audible Swallowing: Spontaneous and intermittent  Type of Nipple: Everted at rest and after stimulation  Comfort (Breast/Nipple): Filling, red/small blisters or bruises, mild/mod discomfort  Problem noted: Mild/Moderate discomfort Interventions (Mild/moderate discomfort): Hand massage;Hand expression  Hold (Positioning): No assistance needed to correctly position infant at breast. Intervention(s): Skin to skin;Position options;Support Pillows;Breastfeeding basics reviewed  LATCH Score: 9  Lactation Tools Discussed/Used     Consult Status Consult Status: Complete Date: 10/03/14    Charyl DancerCARVER, Timiko Offutt G 10/03/2014, 12:52 PM

## 2014-10-03 NOTE — Discharge Instructions (Signed)

## 2014-10-03 NOTE — Discharge Summary (Signed)
  Vaginal Delivery Discharge Summary  Victoria [name]  DOB:    [birth year] MRN:    [id number] CSN:    [id number]  Date of admission:                  10/02/14  Date of discharge:                   10/03/14  Procedures this admission:   SVB  Date of Delivery: 10/02/14  Newborn Data:  Live born female  Birth Weight: 7 lb 2 oz (3232 g) APGAR: 9, 9  Home with mother. Name: [place] Circumcision Plan: Outpatient  History of Present Illness:  Ms. Victoria [name] is a 24 y.o. female, [id number], who presents at [redacted]w[redacted]d weeks gestation. The patient has been followed at [facility]. She was admitted for onset of labor. Her pregnancy has been complicated by:  Patient Active Problem List   Diagnosis Date Noted  . Normal vaginal delivery 10/02/2014  . 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 Drug allergy--amoxicillin 09/30/2014  . Rubella non-immune status, antepartum 09/30/2014     Hospital Course:   Admitting Dx:  IUP at 46 6/7 weeks, GBS negative, active labor, rubella non-immune GBS Status:  Negative Delivering Clinician: Venus Standard, CNM Lacerations/MLE:  None Complications: None  Intrapartum Procedures: spontaneous vaginal delivery Postpartum Procedures: none--Patient currently declining MMR vaccine Complications-Operative and Postpartum: none  Discharge Diagnoses: Term Pregnancy-delivered, SVB  Feeding:  breast  Contraception:  Depo-Provera at 6 weeks.  Hemoglobin Results:  CBC Latest Ref Rng 10/03/2014 10/02/2014 10/23/2009  WBC 4.0 - 10.5 K/uL 11.3(H) 8.7 7.7  Hemoglobin 12.0 - 15.0 g/dL 11.0(L) 12.7 14.9  Hematocrit 36.0 - 46.0 % 33.3(L) 38.5 43.5  Platelets 150 - 400 K/uL 202 226 271    Discharge Physical Exam:   General: alert Lochia: appropriate Uterine Fundus: firm Incision: Intact DVT Evaluation: No evidence of DVT seen on physical exam. Negative Homan's sign.   Discharge  Information:  Activity:           pelvic rest Diet:                routine Medications: Ibuprofen Condition:      stable Instructions:  Routine pp instructions   Discharge to: home  Follow-up Information    Follow up with [facility]. Schedule an appointment as soon as possible for a visit in 6 weeks.   Specialty:  Obstetrics and Gynecology   Why:  Call for any questions or concerns.   Contact information:   [address] [place] [address] [phone]       Ayaka Andes, [place] 10/03/2014 2:10 PM

## 2014-10-03 NOTE — Progress Notes (Signed)
Subjective: Postpartum Day 1: Vaginal delivery, no lacerations Patient up ad lib, reports no syncope or dizziness. Feeding:  Breast Contraceptive plan:  Likely Depo at 6 weeks.  Desires d/c today if possible--no peds visit yet.  Objective: Vital signs in last 24 hours: Temp:  [98.4 F (36.9 C)-98.9 F (37.2 C)] 98.6 F (37 C) (07/05 0635) Pulse Rate:  [75-104] 75 (07/05 0635) Resp:  [18] 18 (07/05 0635) BP: (104-138)/(48-82) 104/48 mmHg (07/05 0635) SpO2:  [100 %] 100 % (07/05 16100635)  Physical Exam:  General: alert Lochia: appropriate Uterine Fundus: firm Perineum: Intact DVT Evaluation: No evidence of DVT seen on physical exam. Negative Homan's sign.  CBC Latest Ref Rng 10/03/2014 10/02/2014 10/23/2009  WBC 4.0 - 10.5 K/uL 11.3(H) 8.7 7.7  Hemoglobin 12.0 - 15.0 g/dL 11.0(L) 12.7 14.9  Hematocrit 36.0 - 46.0 % 33.3(L) 38.5 43.5  Platelets 150 - 400 K/uL 202 226 271     Assessment/Plan: Status post vaginal delivery day 1. Stable Desires d/c today, if baby OK'd for d/c. Will check on status later today. Continue current care.    [place]brook Cohick, VICKICNM 10/03/2014, 10:29 AM

## 2014-10-04 LAB — HIV ANTIBODY (ROUTINE TESTING W REFLEX): HIV Screen 4th Generation wRfx: NONREACTIVE

## 2014-12-11 ENCOUNTER — Encounter (HOSPITAL_COMMUNITY): Payer: Self-pay | Admitting: *Deleted

## 2014-12-11 ENCOUNTER — Emergency Department (INDEPENDENT_AMBULATORY_CARE_PROVIDER_SITE_OTHER)
Admission: EM | Admit: 2014-12-11 | Discharge: 2014-12-11 | Disposition: A | Discharge status: Home / Self Care | Payer: Managed Care, Other (non HMO) | Source: Home / Self Care | Attending: Family Medicine | Admitting: Family Medicine

## 2014-12-11 DIAGNOSIS — H8303 Labyrinthitis, bilateral: Secondary | ICD-10-CM | POA: Diagnosis not present

## 2014-12-11 MED ORDER — FLUTICASONE PROPIONATE 50 MCG/ACT NA SUSP: 1.0000 | Freq: Two times a day (BID) | NASAL | Status: AC

## 2014-12-11 MED ORDER — MECLIZINE HCL 50 MG PO TABS: 50.0000 mg | ORAL_TABLET | Freq: Three times a day (TID) | ORAL | Status: AC | PRN

## 2014-12-11 NOTE — ED Notes (Signed)
Pt  Reports  Symptoms  Of   Being  Dizzy   With  Nausea     And  Headache   With  Onset  Of  Symptoms    Several  Days  Ago  -  She  States   She went [place] an amusement  park  Over the  [employer]

## 2014-12-11 NOTE — ED Provider Notes (Signed)
CSN: [id number]     Arrival date & time 12/11/14  1558 History   First MD Initiated Contact with Patient 12/11/14 1732     Chief Complaint  Patient presents with  . Dizziness   (Consider location/radiation/quality/duration/timing/severity/associated sxs/prior Treatment) Patient is a 24 y.o. female presenting with dizziness. The history is provided by the patient and the spouse.  Dizziness Quality:  Lightheadedness Severity:  Mild Onset quality:  Gradual Duration:  2 days Chronicity:  New Context comment:  Onset after going on rollercoasters on sat at [employer] Relieved by:  None tried Worsened by:  Nothing Ineffective treatments:  None tried Associated symptoms: headaches and nausea   Associated symptoms: no vomiting and no weakness     Past Medical History  Diagnosis Date  . Anemia    Past Surgical History  Procedure Laterality Date  . No past surgeries     History reviewed. No pertinent family history. Social History  Substance Use Topics  . Smoking status: Never Smoker   . Smokeless tobacco: None  . Alcohol Use: No   OB History    Gravida Para Term Preterm AB TAB SAB Ectopic Multiple Living   0 2     Review of Systems  Constitutional: Negative.  Negative for fever and chills.  HENT: Negative for postnasal drip and rhinorrhea.   Respiratory: Negative.   Gastrointestinal: Positive for nausea. Negative for vomiting.  Neurological: Positive for dizziness and headaches. Negative for syncope and weakness.  All other systems reviewed and are negative.   Allergies  Amoxicillin  Home Medications   Prior to Admission medications   Medication Sig Start Date End Date Taking? Authorizing Provider  fluticasone (FLONASE) 50 MCG/ACT nasal spray Place 1 spray into both nostrils 2 (two) times daily. 12/11/14   Linna Hoff, MD  ibuprofen (ADVIL,MOTRIN) 600 MG tablet Take 1 tablet (600 mg total) by mouth every 6 (six) hours as needed. 10/03/14   Nigel Bridgeman,  CNM  meclizine (ANTIVERT) 50 MG tablet Take 1 tablet (50 mg total) by mouth 3 (three) times daily as needed for dizziness or nausea. 12/11/14   Linna Hoff, MD  Prenatal Vit-Fe Fumarate-FA (PRENATAL MULTIVITAMIN) TABS tablet Take 1 tablet by mouth daily.    Historical Provider, MD  ranitidine (ZANTAC) 150 MG tablet Take 150 mg by mouth 2 (two) times daily as needed for heartburn.     Historical Provider, MD   Meds Ordered and Administered this Visit  Medications - No data to display  BP 143/87 mmHg  Pulse 59  Temp(Src) 98.6 F (37 C) (Oral)  Resp 12  SpO2 100%  LMP 12/06/2014  Breastfeeding? No No data found.   Physical Exam  Constitutional: She is oriented to person, place, and time. She appears well-developed and well-nourished.  HENT:  Head: Normocephalic.  Right Ear: External ear normal.  Eyes: EOM are normal. Pupils are equal, round, and reactive to light.  Neck: Normal range of motion. Neck supple.  Cardiovascular: Normal rate, regular rhythm, normal heart sounds and intact distal pulses.   Pulmonary/Chest: Effort normal and breath sounds normal.  Abdominal: Soft. Bowel sounds are normal.  Lymphadenopathy:    She has no cervical adenopathy.  Neurological: She is alert and oriented to person, place, and time. No cranial nerve deficit. Coordination normal.  Skin: Skin is warm and dry.  Nursing note and vitals reviewed.   ED Course  Procedures (including critical care time)  Labs Review Labs  Reviewed - No data to display  Imaging Review No results found.   Visual Acuity Review  Right Eye Distance:   Left Eye Distance:   Bilateral Distance:    Right Eye Near:   Left Eye Near:    Bilateral Near:         MDM   1. Labyrinthitis, bilateral     rx flonase and meclizine    Linna Hoff, MD 12/11/14 [phone]

## 2016-03-26 IMAGING — US US FETAL BPP W/O NONSTRESS
1 series · 11 of 11 positions shown · non-contrast
Comparison: none

[Series 1: us fetal bpp w/o nonstress · non-contrast · 11 acquisitions, 11 frames shown]
[im 1/11]
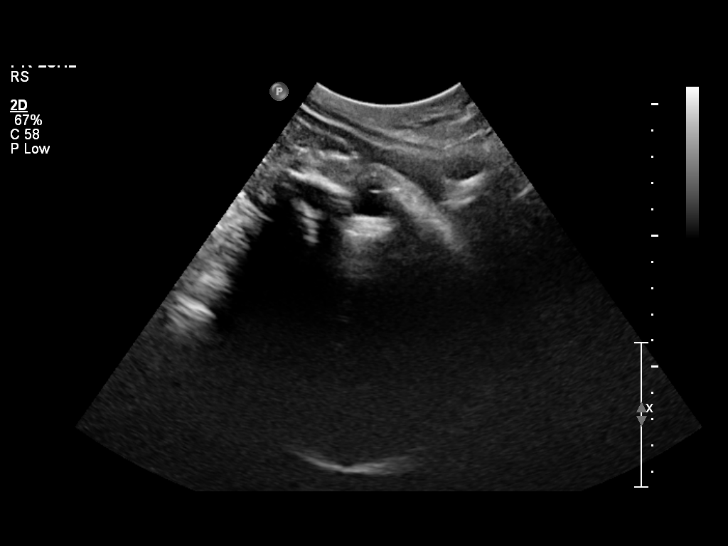
[im 2/11]
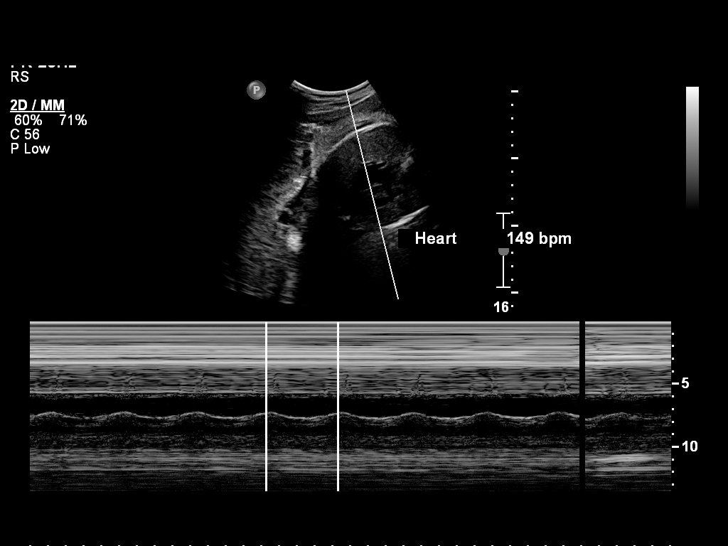
[im 3/11]
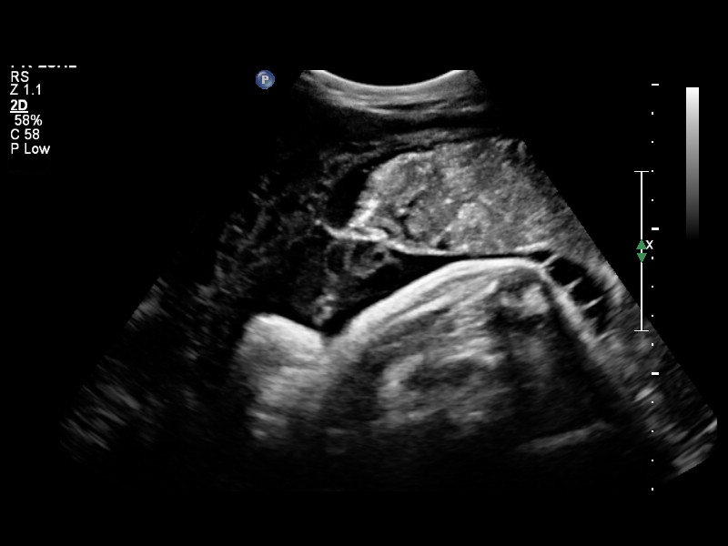
[im 4/11]
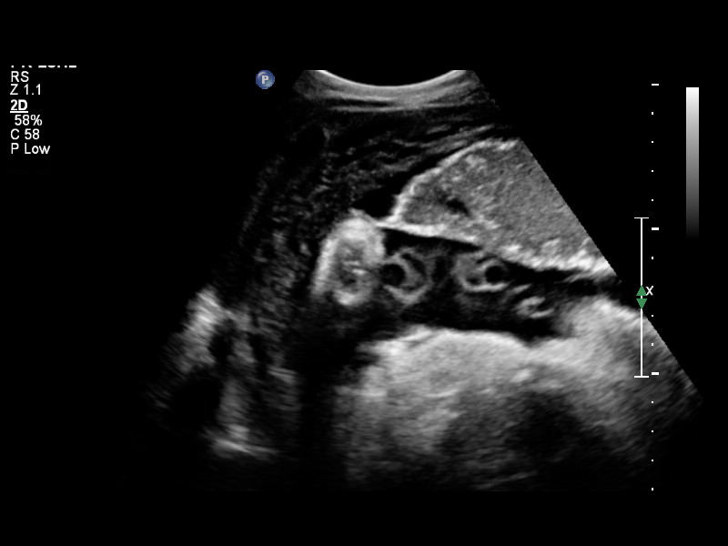
[im 5/11]
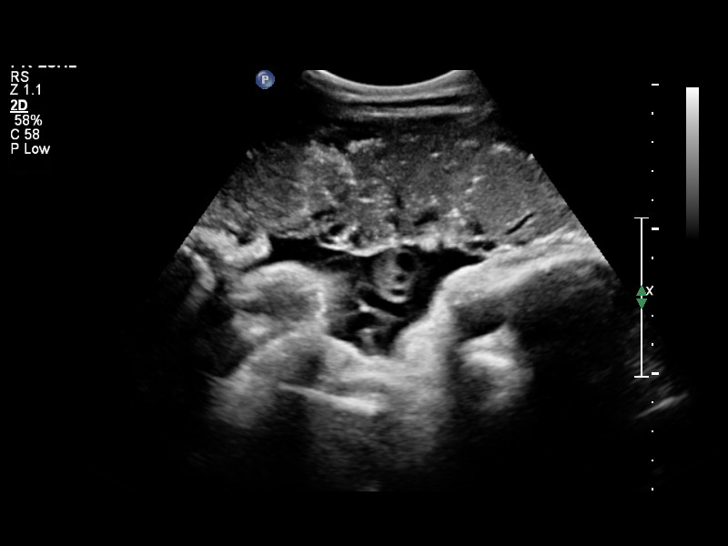
[im 6/11]
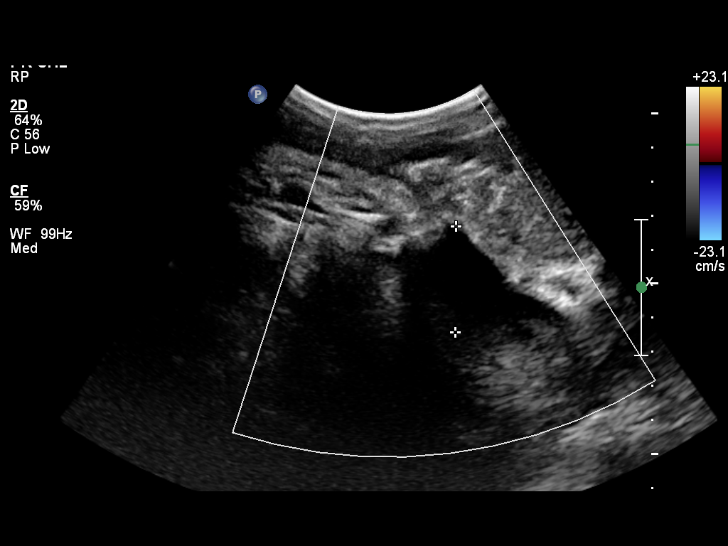
[im 7/11]
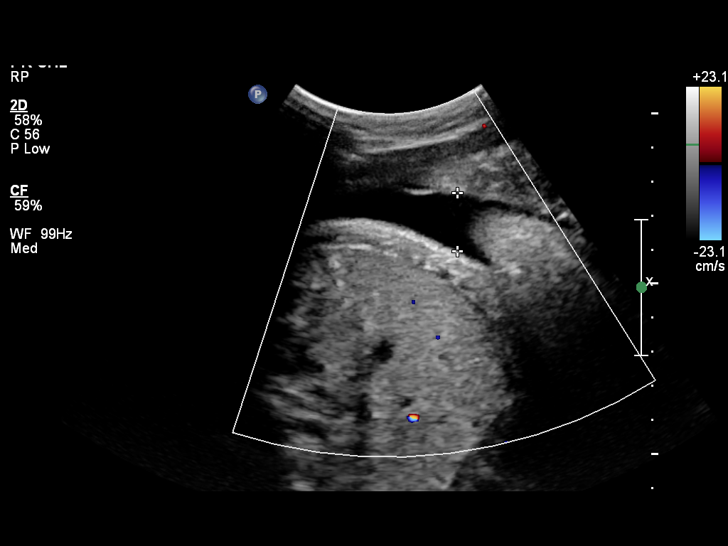
[im 8/11]
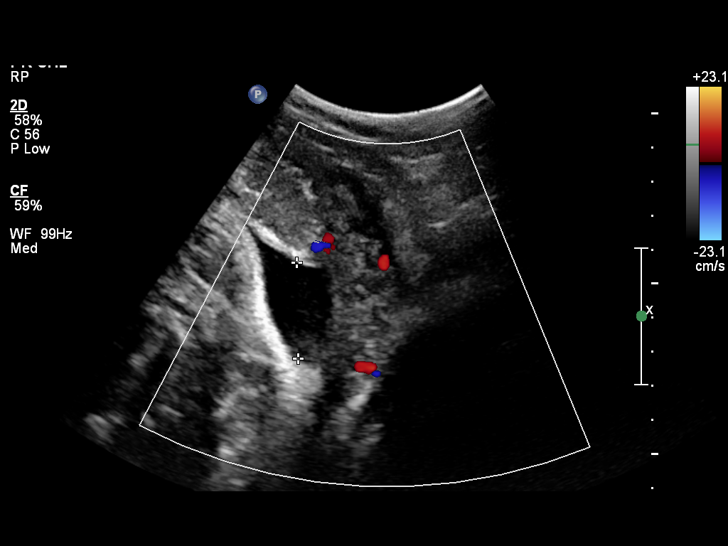
[im 9/11]
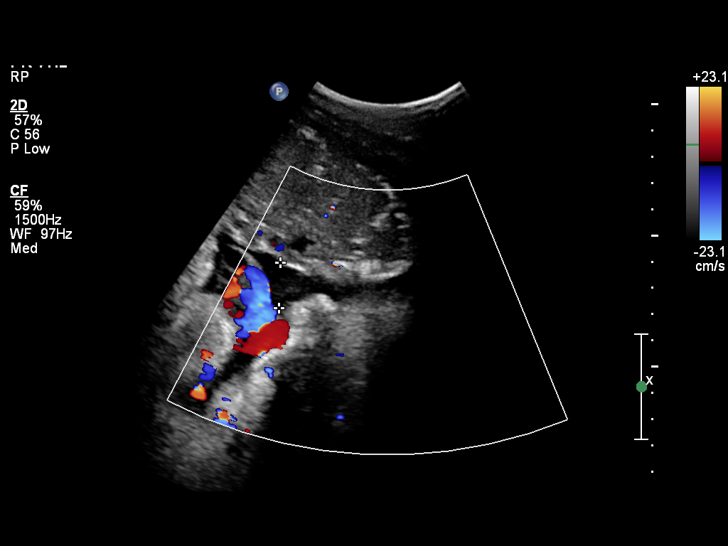
[im 10/11]
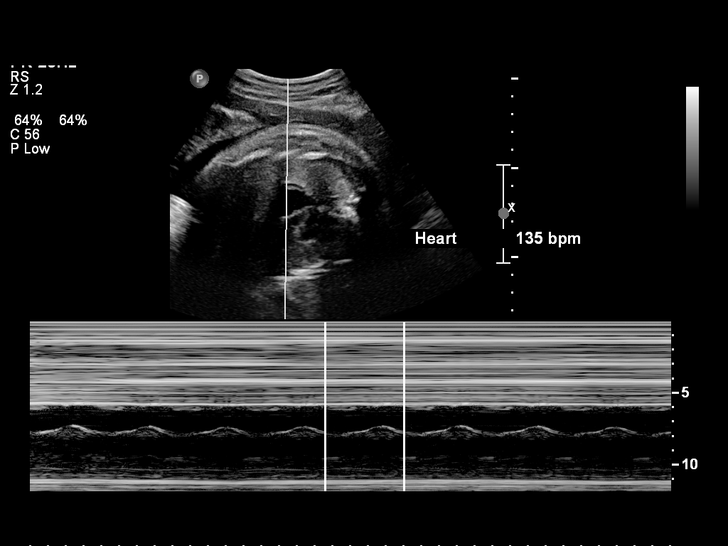
[im 11/11]
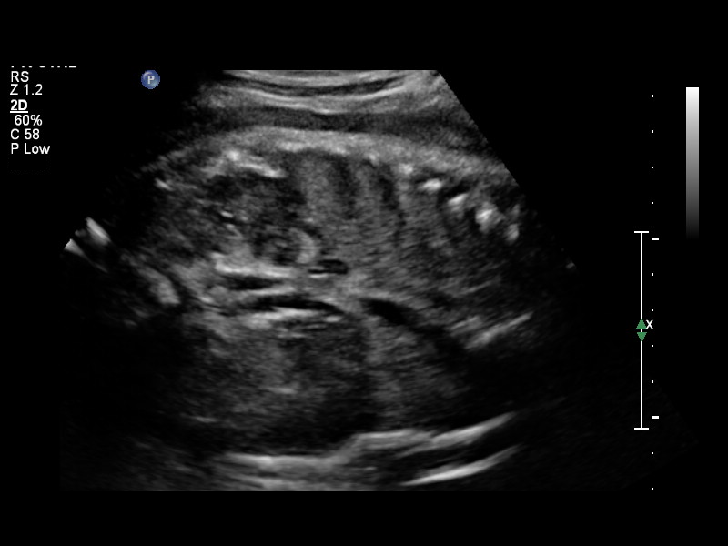

[11 of 11 positions shown; findings below may reference images not displayed]

OBSTETRICS REPORT
(Signed Final 10/02/2014 [DATE])

Service(s) Provided

Indications

Non-reactive NST
39 weeks gestation of pregnancy
Fetal Evaluation

Num Of Fetuses:    1
Fetal Heart Rate:  149                          bpm
Cardiac Activity:  Observed
Presentation:      Cephalic

Amniotic Fluid
AFI FV:      Subjectively within normal limits
AFI Sum:     9.33     cm      23  %Tile      Larg Pckt:    3.1  cm
RUQ:   3.1     cm   RLQ:    1.73   cm    LUQ:    1.7    cm    LLQ:   2.8     cm
Biophysical Evaluation

Amniotic F.V:   Pocket => 2 cm two         F. Tone:         Observed
planes
F. Movement:    Observed                   Score:           [DATE]
F. Breathing:   Observed
Gestational Age

LMP:           39w 4d        Date:  12/27/13                 EDD:    10/03/14
Best:          39w 4d     Det. By:  LMP  (12/27/13)          EDD:    10/03/14
Impression

SIUP at 39+4 weeks
Cephalic presentation
Normal amniotic fluid volume
BPP [DATE]
Recommendations

Follow-up as clinically indicated

## 2018-10-10 ENCOUNTER — Emergency Department (HOSPITAL_COMMUNITY): Payer: Self-pay

## 2018-10-10 ENCOUNTER — Encounter (HOSPITAL_COMMUNITY): Payer: Self-pay | Admitting: *Deleted

## 2018-10-10 ENCOUNTER — Emergency Department (HOSPITAL_COMMUNITY)
Admission: EM | Admit: 2018-10-10 | Discharge: 2018-10-11 | Disposition: A | Discharge status: Home / Self Care | Payer: Self-pay | Attending: Emergency Medicine | Admitting: Emergency Medicine

## 2018-10-10 ENCOUNTER — Other Ambulatory Visit: Payer: Self-pay

## 2018-10-10 DIAGNOSIS — K219 Gastro-esophageal reflux disease without esophagitis: Secondary | ICD-10-CM | POA: Insufficient documentation

## 2018-10-10 LAB — BASIC METABOLIC PANEL
Anion gap: 13 (ref 5–15)
BUN: 12 mg/dL (ref 6–20)
CO2: 17 mmol/L — ABNORMAL LOW (ref 22–32)
Calcium: 9.4 mg/dL (ref 8.9–10.3)
Chloride: 107 mmol/L (ref 98–111)
Creatinine, Ser: 0.68 mg/dL (ref 0.44–1.00)
GFR calc Af Amer: >60 mL/min (ref >60)
GFR calc non Af Amer: >60 mL/min (ref >60)
Glucose, Bld: 93 mg/dL (ref 70–99)
Potassium: 3.7 mmol/L (ref 3.5–5.1)
Sodium: 137 mmol/L (ref 135–145)

## 2018-10-10 LAB — CBC
HCT: 40.3 % (ref 36.0–46.0)
Hemoglobin: 12.9 g/dL (ref 12.0–15.0)
MCH: 28.3 pg (ref 26.0–34.0)
MCHC: 32 g/dL (ref 30.0–36.0)
MCV: 88.4 fL (ref 80.0–100.0)
Platelets: 288 10*3/uL (ref 150–400)
RBC: 4.56 MIL/uL (ref 3.87–5.11)
RDW: 12.4 % (ref 11.5–15.5)
WBC: 6.3 10*3/uL (ref 4.0–10.5)
nRBC: 0 % (ref 0.0–0.2)

## 2018-10-10 LAB — I-STAT BETA HCG BLOOD, ED (MC, WL, AP ONLY): I-stat hCG, quantitative: <5 m[IU]/mL (ref <5)

## 2018-10-10 LAB — TROPONIN I (HIGH SENSITIVITY): Troponin I (High Sensitivity): <2 ng/L (ref <18)

## 2018-10-10 MED ORDER — SODIUM CHLORIDE 0.9% FLUSH: 3.0000 mL | Freq: Once | INTRAVENOUS | Status: DC

## 2018-10-10 NOTE — ED Triage Notes (Signed)
Pt reports onset of left side chest pain and back pain that started yesterday, becoming consistent today. Has mild cough, non productive. No acute distress is noted at triage. Pain increases with moving, breathing, coughing.

## 2018-10-11 LAB — TROPONIN I (HIGH SENSITIVITY): Troponin I (High Sensitivity): <2 ng/L (ref <18)

## 2018-10-11 MED ORDER — OMEPRAZOLE 20 MG PO CPDR: 20.0000 mg | DELAYED_RELEASE_CAPSULE | Freq: Every day | ORAL | 0 refills | Status: AC

## 2018-10-11 MED ORDER — IBUPROFEN 800 MG PO TABS
800.0000 mg | ORAL_TABLET | Freq: Once | ORAL | Status: AC
  Administered 2018-10-11: 02:00:00 800 mg via ORAL
  Filled 2018-10-11: qty 1

## 2018-10-11 MED ORDER — ALUM & MAG HYDROXIDE-SIMETH 200-200-20 MG/5ML PO SUSP
30.0000 mL | Freq: Once | ORAL | Status: AC
  Administered 2018-10-11: 02:00:00 30 mL via ORAL
  Filled 2018-10-11: qty 30

## 2018-10-11 MED ORDER — LIDOCAINE VISCOUS HCL 2 % MT SOLN
15.0000 mL | Freq: Once | OROMUCOSAL | Status: AC
  Administered 2018-10-11: 02:00:00 15 mL via ORAL
  Filled 2018-10-11: qty 15

## 2018-10-11 MED ORDER — ACETAMINOPHEN 500 MG PO TABS
1000.0000 mg | ORAL_TABLET | Freq: Once | ORAL | Status: AC
  Administered 2018-10-11: 1000 mg via ORAL
  Filled 2018-10-11: qty 2

## 2018-10-11 NOTE — ED Provider Notes (Signed)
[facility]   CSN: [id number] Arrival date & time: 10/10/18  2111     History   Chief Complaint Chief Complaint  Patient presents with  . Chest Pain    HPI Hart CarwinMercia Beverley FiedlerJanet [name] is a 28 y.o. female.     The history is provided by the patient.  Chest Pain Pain location:  Substernal area Pain quality: burning   Radiates to: bitter acidic taste in the throat at times.  Has had a lot of spicy seafood this week  Pain severity:  Moderate Duration:  2 days Timing:  Constant Progression:  Waxing and waning Chronicity:  New Context: lifting, raising an arm and at rest   Relieved by:  Nothing Worsened by:  Nothing Ineffective treatments:  None tried Associated symptoms: no altered mental status, no anorexia, no claudication, no cough, no diaphoresis, no dysphagia, no fever, no orthopnea, no PND and no shortness of breath   Risk factors: not female, not obese, not pregnant, no prior DVT/PE and no surgery   Denies cough, denies anosmia.  No f/c/r.  No sore throat, no diarrhea.  No contacts.    Past Medical History:  Diagnosis Date  . Anemia     Patient Active Problem List   Diagnosis Date Noted  . Normal vaginal delivery 10/02/2014  . 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 Drug allergy--amoxicillin 09/30/2014  . Rubella non-immune status, antepartum 09/30/2014    Past Surgical History:  Procedure Laterality Date  . NO PAST SURGERIES       OB History    Gravida  3   Para  2   Term  2   Preterm      AB  1   Living  2     SAB      TAB  1   Ectopic      Multiple  0   Live Births  2            Home Medications    Prior to Admission medications   Medication Sig Start Date End Date Taking? Authorizing Provider  fluticasone (FLONASE) 50 MCG/ACT nasal spray Place 1 spray into both nostrils 2 (two) times daily. 12/11/14   Linna HoffKindl, James D, MD  ibuprofen (ADVIL,MOTRIN) 600 MG tablet Take 1 tablet  (600 mg total) by mouth every 6 (six) hours as needed. 10/03/14   Nigel BridgemanLatham, Vicki, CNM  meclizine (ANTIVERT) 50 MG tablet Take 1 tablet (50 mg total) by mouth 3 (three) times daily as needed for dizziness or nausea. 12/11/14   Linna HoffKindl, James D, MD  Prenatal Vit-Fe Fumarate-FA (PRENATAL MULTIVITAMIN) TABS tablet Take 1 tablet by mouth daily.    [provider]  ranitidine (ZANTAC) 150 MG tablet Take 150 mg by mouth 2 (two) times daily as needed for heartburn.     [provider]    Family History History reviewed. No pertinent family history.  Social History Social History   Tobacco Use  . Smoking status: Never Smoker  Substance Use Topics  . Alcohol use: No  . Drug use: No     Allergies   Amoxicillin   Review of Systems Review of Systems  Constitutional: Negative for diaphoresis and fever.  HENT: Negative for sore throat, trouble swallowing and voice change.   Respiratory: Negative for cough and shortness of breath.   Cardiovascular: Positive for chest pain. Negative for orthopnea, claudication, leg swelling and PND.  Gastrointestinal: Negative for anorexia and diarrhea.  All  other systems reviewed and are negative.    Physical Exam Updated Vital Signs BP 139/85 (BP Location: Right Arm)   Pulse 70   Temp 98.8 F (37.1 C) (Oral)   Resp 17   LMP 10/10/2018   SpO2 100%   Physical Exam Vitals signs and nursing note reviewed.  Constitutional:      General: She is not in acute distress.    Appearance: She is normal weight.  HENT:     Head: Normocephalic and atraumatic.     Nose: Nose normal.  Eyes:     Conjunctiva/sclera: Conjunctivae normal.     Pupils: Pupils are equal, round, and reactive to light.  Neck:     Musculoskeletal: Normal range of motion and neck supple.  Cardiovascular:     Rate and Rhythm: Normal rate and regular rhythm.     Pulses: Normal pulses.     Heart sounds: Normal heart sounds.  Pulmonary:     Effort: Pulmonary effort is  normal.     Breath sounds: Normal breath sounds. No wheezing or rales.  Abdominal:     General: Abdomen is flat. Bowel sounds are normal.     Tenderness: There is no abdominal tenderness. There is no guarding.  Musculoskeletal: Normal range of motion.        General: No tenderness.     Right lower leg: No edema.     Left lower leg: No edema.  Skin:    General: Skin is warm and dry.     Capillary Refill: Capillary refill takes less than 2 seconds.  Neurological:     General: No focal deficit present.     Mental Status: She is alert and oriented to person, place, and time.     Deep Tendon Reflexes: Reflexes normal.  Psychiatric:        Mood and Affect: Mood is anxious.      ED Treatments / Results  Labs (all labs ordered are listed, but only abnormal results are displayed) Results for orders placed or performed during the hospital encounter of 21/19/41  Basic metabolic panel  Result Value Ref Range   Sodium 137 135 - 145 mmol/L   Potassium 3.7 3.5 - 5.1 mmol/L   Chloride 107 98 - 111 mmol/L   CO2 17 (L) 22 - 32 mmol/L   Glucose, Bld 93 70 - 99 mg/dL   BUN 12 6 - 20 mg/dL   Creatinine, Ser 0.68 0.44 - 1.00 mg/dL   Calcium 9.4 8.9 - 10.3 mg/dL   GFR calc non Af Amer >60 >60 mL/min   GFR calc Af Amer >60 >60 mL/min   Anion gap 13 5 - 15  CBC  Result Value Ref Range   WBC 6.3 4.0 - 10.5 K/uL   RBC 4.56 3.87 - 5.11 MIL/uL   Hemoglobin 12.9 12.0 - 15.0 g/dL   HCT 40.3 36.0 - 46.0 %   MCV 88.4 80.0 - 100.0 fL   MCH 28.3 26.0 - 34.0 pg   MCHC 32.0 30.0 - 36.0 g/dL   RDW 12.4 11.5 - 15.5 %   Platelets 288 150 - 400 K/uL   nRBC 0.0 0.0 - 0.2 %  I-Stat beta hCG blood, ED  Result Value Ref Range   I-stat hCG, quantitative <5.0 <5 mIU/mL   Comment 3          Troponin I (High Sensitivity)  Result Value Ref Range   Troponin I (High Sensitivity) <2 <18 ng/L  Troponin I (High Sensitivity)  Result Value Ref Range   Troponin I (High Sensitivity) <2 <18 ng/L   Dg Chest 2 View   Result Date: 10/10/2018 CLINICAL DATA:  Chest pain mild nonproductive cough EXAM: CHEST - 2 VIEW COMPARISON:  None. FINDINGS: The heart size and mediastinal contours are within normal limits. Both lungs are clear. The visualized skeletal structures are unremarkable. IMPRESSION: No active cardiopulmonary disease. Electronically Signed   By: Jasmine PangKim  Fujinaga M.D.   On: 10/10/2018 21:48    EKG EKG Interpretation  Date/Time:  Sunday October 10 2018 21:20:56 EDT Ventricular Rate:  93 PR Interval:  138 QRS Duration: 70 QT Interval:  330 QTC Calculation: 410 R Axis:   50 Text Interpretation:  Normal sinus rhythm Confirmed by Nicanor AlconPalumbo, Caid Radin ([id number]) on 10/11/2018 1:56:22 AM   Radiology Dg Chest 2 View  Result Date: 10/10/2018 CLINICAL DATA:  Chest pain mild nonproductive cough EXAM: CHEST - 2 VIEW COMPARISON:  None. FINDINGS: The heart size and mediastinal contours are within normal limits. Both lungs are clear. The visualized skeletal structures are unremarkable. IMPRESSION: No active cardiopulmonary disease. Electronically Signed   By: Jasmine PangKim  Fujinaga M.D.   On: 10/10/2018 21:48    Procedures Procedures (including critical care time)  Medications Ordered in ED Medications  sodium chloride flush (NS) 0.9 % injection 3 mL (has no administration in time range)  acetaminophen (TYLENOL) tablet 1,000 mg (has no administration in time range)  ibuprofen (ADVIL) tablet 800 mg (has no administration in time range)  alum & mag hydroxide-simeth (MAALOX/MYLANTA) 200-200-20 MG/5ML suspension 30 mL (has no administration in time range)    And  lidocaine (XYLOCAINE) 2 % viscous mouth solution 15 mL (has no administration in time range)      PERC negative wells 0 highly doubt PE in this low risk patient.  I suspect there is a component of anxiety.  I suspect that is it is GERD with an MSK component.    Patient will set up outpatient COVID swabbing if she is concerned.   Final Clinical Impressions(s) / ED  Diagnoses   Return for intractable cough, coughing up blood,fevers >100.4 unrelieved by medication, shortness of breath, intractable vomiting, chest pain, shortness of breath, weakness,numbness, changes in speech, facial asymmetry,abdominal pain, passing out,Inability to tolerate liquids or food, cough, altered mental status or any concerns. No signs of systemic illness or infection. The patient is nontoxic-appearing on exam and vital signs are within normal limits.   I have reviewed the triage vital signs and the nursing notes. Pertinent labs &imaging results that were available during my care of the patient were reviewed by me and considered in my medical decision making (see chart for details).  After history, exam, and medical workup I feel the patient has been appropriately medically screened and is safe for discharge home. Pertinent diagnoses were discussed with the patient. Patient was given return precautions     Zariana Strub, MD 10/11/18 [id number]

## 2018-10-11 NOTE — ED Notes (Signed)
Patient verbalizes understanding of discharge instructions. Opportunity for questioning and answers were provided. Armband removed by staff, pt discharged from ED.  

## 2018-10-14 ENCOUNTER — Telehealth: Payer: Medicaid Other | Admitting: Family

## 2018-10-14 ENCOUNTER — Other Ambulatory Visit: Payer: Self-pay | Admitting: General Practice

## 2018-10-14 DIAGNOSIS — Z20822 Contact with and (suspected) exposure to covid-19: Secondary | ICD-10-CM

## 2018-10-14 MED ORDER — ALBUTEROL SULFATE HFA 108 (90 BASE) MCG/ACT IN AERS: 2.0000 | INHALATION_SPRAY | RESPIRATORY_TRACT | 1 refills | Status: AC | PRN

## 2018-10-14 MED ORDER — BENZONATATE 100 MG PO CAPS: 100.0000 mg | ORAL_CAPSULE | Freq: Three times a day (TID) | ORAL | 0 refills | Status: AC | PRN

## 2018-10-14 NOTE — Progress Notes (Signed)
Greater than 5 minutes, yet less than 10 minutes of time have been spent researching, coordinating, and implementing care for this patient today.  Thank you for the details you included in the comment boxes. Those details are very helpful in determining the best course of treatment for you and help us to provide the best care.  E-Visit for Corona Virus Screening   Your current symptoms could be consistent with the coronavirus.  Many health care providers can now test patients at their office but not all are.  [facility] has multiple testing sites. For information on our COVID testing locations and hours go to [email or web address]  Please quarantine yourself while awaiting your test results.    COVID-19 is a respiratory illness with symptoms that are similar to the flu. Symptoms are typically mild to moderate, but there have been cases of severe illness and death due to the virus. The following symptoms may appear 2-14 days after exposure: . Fever . Cough . Shortness of breath or difficulty breathing . Chills . Repeated shaking with chills . Muscle pain . Headache . Sore throat . New loss of taste or smell . Fatigue . Congestion or runny nose . Nausea or vomiting . Diarrhea  It is vitally important that if you feel that you have an infection such as this virus or any other virus that you stay home and away from places where you may spread it to others.  You should self-quarantine for 14 days if you have symptoms that could potentially be coronavirus or have been in close contact a with a person diagnosed with COVID-19 within the last 2 weeks. You should avoid contact with people age 65 and older.   You should wear a mask or cloth face covering over your nose and mouth if you must be around other people or animals, including pets (even at home). Try to stay at least 6 feet away from other people. This will protect the people around you.  You can use medication  such as A prescription cough medication called Tessalon Perles 100 mg. You may take 1-2 capsules every 8 hours as needed for cough and A prescription inhaler called Albuterol MDI 90 mcg /actuation 2 puffs every 4 hours as needed for shortness of breath, wheezing, cough  You may also take acetaminophen (Tylenol) as needed for fever.   Reduce your risk of any infection by using the same precautions used for avoiding the common cold or flu:  . Wash your hands often with soap and warm water for at least 20 seconds.  If soap and water are not readily available, use an alcohol-based hand sanitizer with at least 60% alcohol.  . If coughing or sneezing, cover your mouth and nose by coughing or sneezing into the elbow areas of your shirt or coat, into a tissue or into your sleeve (not your hands). . Avoid shaking hands with others and consider head nods or verbal greetings only. . Avoid touching your eyes, nose, or mouth with unwashed hands.  . Avoid close contact with people who are sick. . Avoid places or events with large numbers of people in one location, like concerts or sporting events. . Carefully consider travel plans you have or are making. . If you are planning any travel outside or inside the [place], visit the CDC's Travelers' Health webpage for the latest health notices. . If you have some symptoms but not all symptoms, continue to monitor at home and seek medical attention if your symptoms worsen. .   If you are having a medical emergency, call 911.  HOME CARE . Only take medications as instructed by your medical team. . Drink plenty of fluids and get plenty of rest. . A steam or ultrasonic humidifier can help if you have congestion.   GET HELP RIGHT AWAY IF YOU HAVE EMERGENCY WARNING SIGNS** FOR COVID-19. If you or someone is showing any of these signs seek emergency medical care immediately. Call 911 or proceed to your closest emergency facility if: . You develop worsening high  fever. . Trouble breathing . Bluish lips or face . Persistent pain or pressure in the chest . New confusion . Inability to wake or stay awake . You cough up blood. . Your symptoms become more severe  **This list is not all possible symptoms. Contact your medical provider for any symptoms that are sever or concerning to you.   MAKE SURE YOU   Understand these instructions.  Will watch your condition.  Will get help right away if you are not doing well or get worse.  Your e-visit answers were reviewed by a board certified advanced clinical practitioner to complete your personal care plan.  Depending on the condition, your plan could have included both over the counter or prescription medications.  If there is a problem please reply once you have received a response from your provider.  Your safety is important to us.  If you have drug allergies check your prescription carefully.    You can use MyChart to ask questions about today's visit, request a non-urgent call back, or ask for a work or school excuse for 24 hours related to this e-Visit. If it has been greater than 24 hours you will need to follow up with your provider, or enter a new e-Visit to address those concerns. You will get an e-mail in the next two days asking about your experience.  I hope that your e-visit has been valuable and will speed your recovery. Thank you for using e-visits.    

## 2018-10-19 LAB — NOVEL CORONAVIRUS, NAA: SARS-CoV-2, NAA: NOT DETECTED

## 2020-04-05 IMAGING — CR CHEST - 2 VIEW
2 series · 2 of 2 positions shown · non-contrast
Comparison: None.

CLINICAL DATA: Chest pain mild nonproductive cough

EXAM:
CHEST - 2 VIEW

[chest pa]
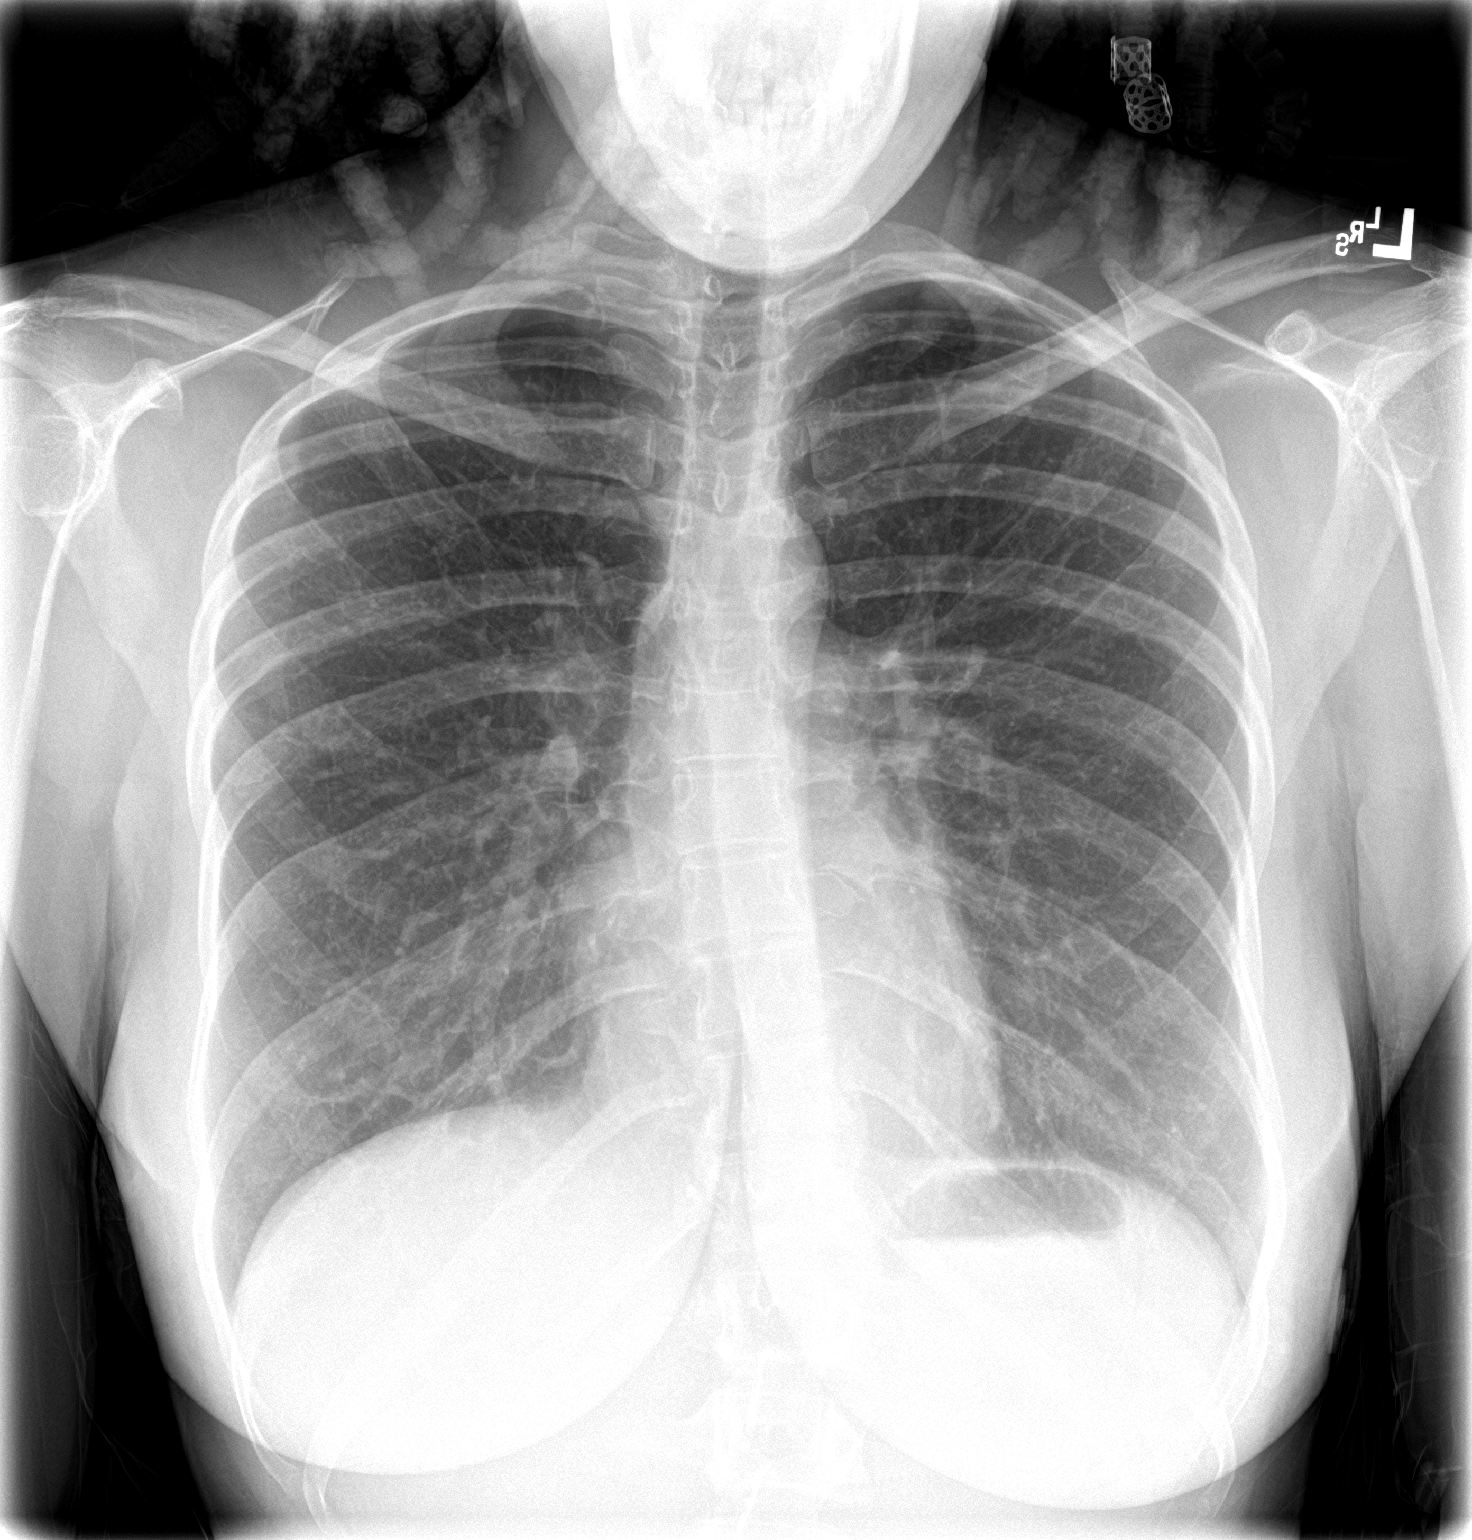

[chest lat]
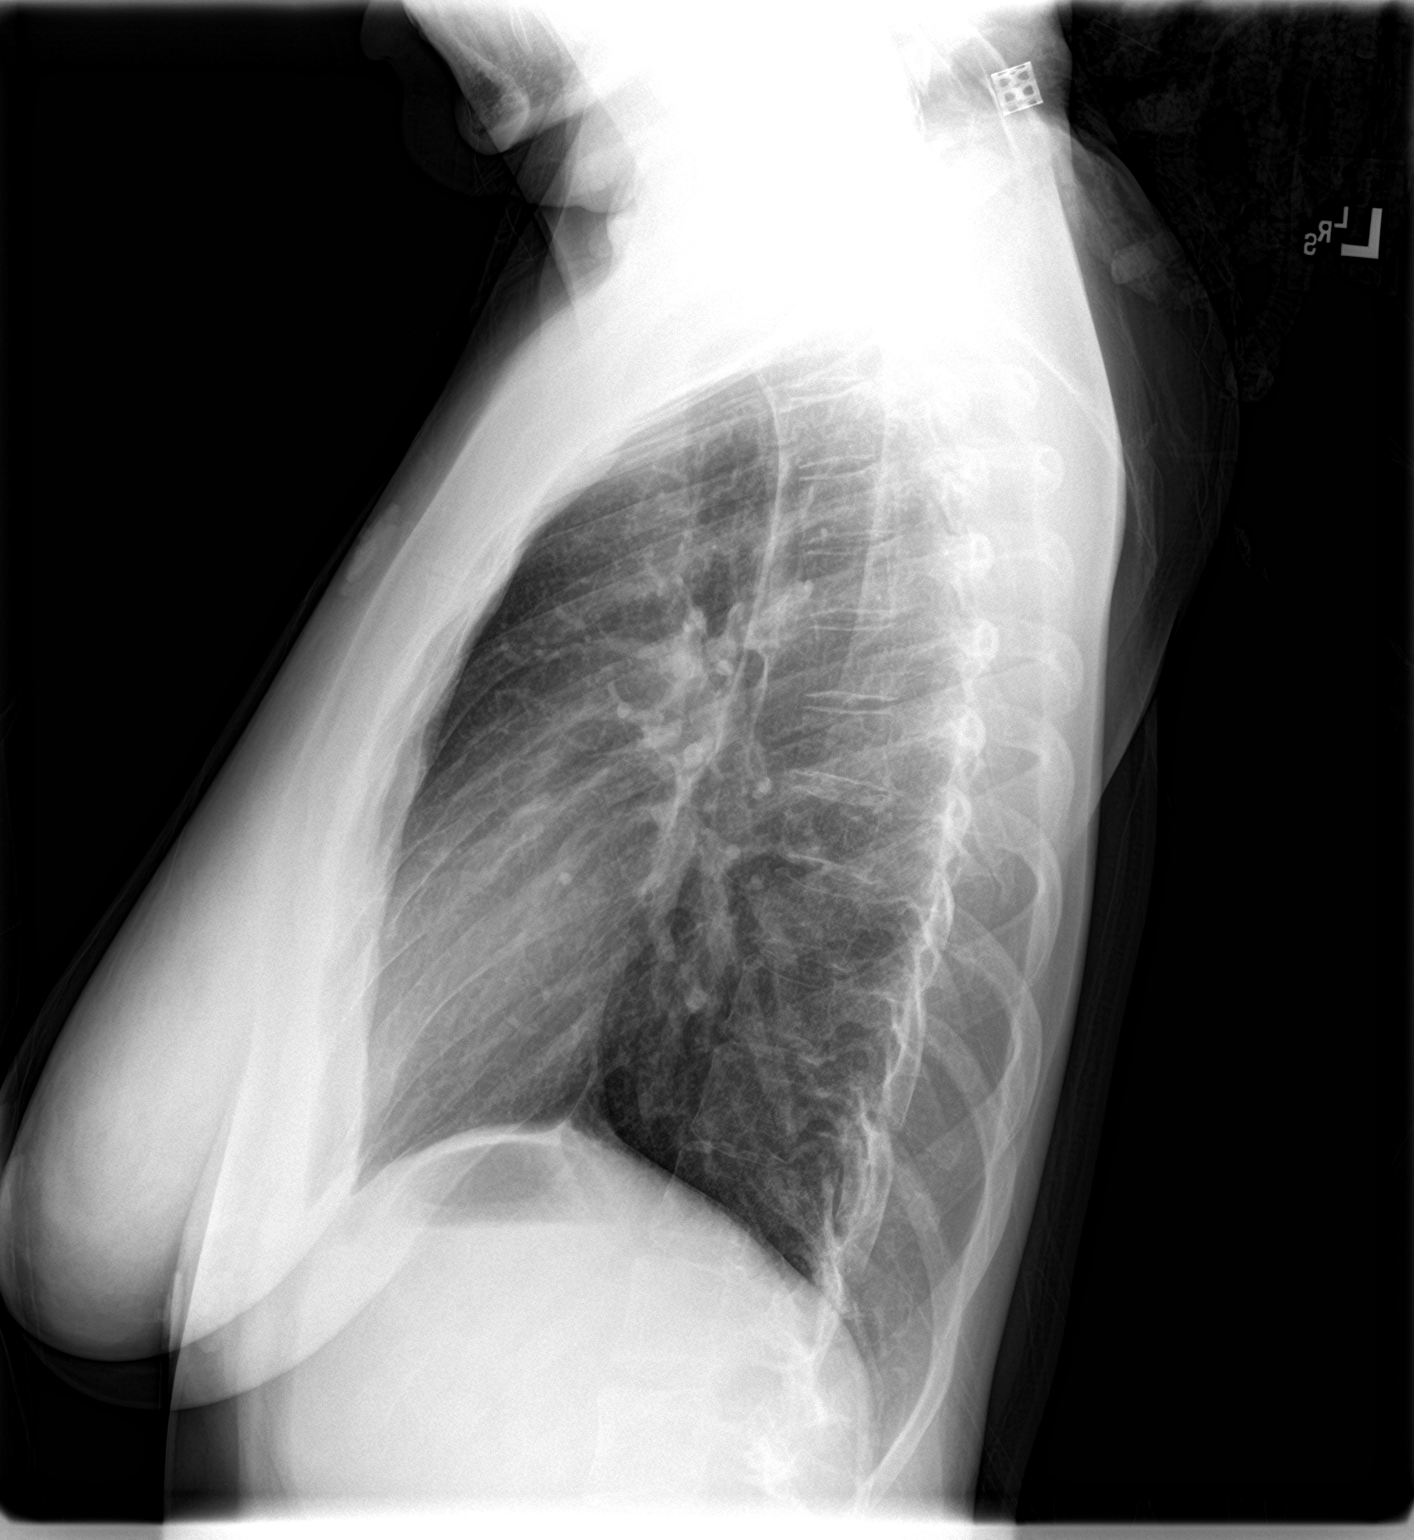

[2 of 2 positions shown; findings below may reference images not displayed]

FINDINGS: The heart size and mediastinal contours are within normal limits.
Both lungs are clear. The visualized skeletal structures are
unremarkable.
IMPRESSION: No active cardiopulmonary disease.
